# Patient Record
Sex: Male | Born: 1980 | Race: White | Hispanic: No | Marital: Married | Smoking: Current some day smoker
Health system: Southern US, Community
[De-identification: ages and names within clinical notes are randomized; demographics above are authoritative.]

## PROBLEM LIST (undated history)

## (undated) DIAGNOSIS — D126 Benign neoplasm of colon, unspecified: Secondary | ICD-10-CM

## (undated) DIAGNOSIS — G4733 Obstructive sleep apnea (adult) (pediatric): Secondary | ICD-10-CM

## (undated) DIAGNOSIS — M67979 Unspecified disorder of synovium and tendon, unspecified ankle and foot: Secondary | ICD-10-CM

## (undated) DIAGNOSIS — IMO0002 Reserved for concepts with insufficient information to code with codable children: Secondary | ICD-10-CM

## (undated) DIAGNOSIS — I10 Essential (primary) hypertension: Secondary | ICD-10-CM

## (undated) DIAGNOSIS — R7303 Prediabetes: Secondary | ICD-10-CM

## (undated) HISTORY — DX: Obstructive sleep apnea (adult) (pediatric): G47.33

## (undated) HISTORY — PX: FOOT SURGERY: SHX648

## (undated) HISTORY — DX: Prediabetes: R73.03

## (undated) HISTORY — DX: Reserved for concepts with insufficient information to code with codable children: IMO0002

## (undated) HISTORY — DX: Essential (primary) hypertension: I10

## (undated) HISTORY — PX: URETHROPLASTY: SHX499

---
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
## 2001-09-16 ENCOUNTER — Encounter: Payer: Self-pay | Admitting: Emergency Medicine

## 2001-09-16 ENCOUNTER — Emergency Department (HOSPITAL_COMMUNITY): Admission: EM | Admit: 2001-09-16 | Discharge: 2001-09-17 | Payer: Self-pay | Admitting: Emergency Medicine

## 2004-09-10 ENCOUNTER — Ambulatory Visit (HOSPITAL_BASED_OUTPATIENT_CLINIC_OR_DEPARTMENT_OTHER): Admission: RE | Admit: 2004-09-10 | Discharge: 2004-09-10 | Payer: Self-pay | Admitting: Urology

## 2006-09-03 ENCOUNTER — Ambulatory Visit: Payer: Self-pay | Admitting: Family Medicine

## 2006-09-06 ENCOUNTER — Ambulatory Visit: Payer: Self-pay | Admitting: Family Medicine

## 2007-01-03 ENCOUNTER — Ambulatory Visit: Payer: Self-pay | Admitting: Family Medicine

## 2007-07-18 ENCOUNTER — Ambulatory Visit (HOSPITAL_COMMUNITY): Admission: RE | Admit: 2007-07-18 | Discharge: 2007-07-18 | Payer: Self-pay | Admitting: Urology

## 2007-08-15 HISTORY — PX: URETHRAL STRICTURE DILATATION: SHX477

## 2007-09-06 ENCOUNTER — Ambulatory Visit (HOSPITAL_COMMUNITY): Admission: RE | Admit: 2007-09-06 | Discharge: 2007-09-07 | Payer: Self-pay | Admitting: Urology

## 2007-09-09 ENCOUNTER — Encounter: Payer: Self-pay | Admitting: Family Medicine

## 2007-10-05 ENCOUNTER — Encounter: Payer: Self-pay | Admitting: Family Medicine

## 2008-01-23 ENCOUNTER — Ambulatory Visit: Payer: Self-pay | Admitting: Family Medicine

## 2008-01-24 DIAGNOSIS — N35919 Unspecified urethral stricture, male, unspecified site: Secondary | ICD-10-CM | POA: Insufficient documentation

## 2008-01-26 LAB — CONVERTED CEMR LAB
BUN: 18 mg/dL (ref 6–23)
CO2: 29 meq/L (ref 19–32)
Calcium: 9.3 mg/dL (ref 8.4–10.5)
Chloride: 104 meq/L (ref 96–112)
Cholesterol: 171 mg/dL (ref 0–200)
Creatinine, Ser: 1.1 mg/dL (ref 0.4–1.5)
GFR calc Af Amer: 103 mL/min
GFR calc non Af Amer: 85 mL/min
Glucose, Bld: 103 mg/dL — ABNORMAL HIGH (ref 70–99)
HDL: 27.5 mg/dL — ABNORMAL LOW (ref >39.0)
LDL Cholesterol: 122 mg/dL — ABNORMAL HIGH (ref 0–99)
Potassium: 4.3 meq/L (ref 3.5–5.1)
Sodium: 140 meq/L (ref 135–145)
TSH: 2.28 microintl units/mL (ref 0.35–5.50)
Total CHOL/HDL Ratio: 6.2
Triglycerides: 106 mg/dL (ref 0–149)
VLDL: 21 mg/dL (ref 0–40)

## 2008-12-17 ENCOUNTER — Telehealth: Payer: Self-pay | Admitting: Family Medicine

## 2011-02-26 ENCOUNTER — Ambulatory Visit (INDEPENDENT_AMBULATORY_CARE_PROVIDER_SITE_OTHER): Payer: BC Managed Care – PPO | Admitting: Family Medicine

## 2011-02-26 ENCOUNTER — Other Ambulatory Visit: Payer: Self-pay | Admitting: Family Medicine

## 2011-02-26 ENCOUNTER — Ambulatory Visit (INDEPENDENT_AMBULATORY_CARE_PROVIDER_SITE_OTHER)
Admission: RE | Admit: 2011-02-26 | Discharge: 2011-02-26 | Disposition: A | Discharge status: Home / Self Care | Payer: BC Managed Care – PPO | Source: Ambulatory Visit | Attending: Family Medicine | Admitting: Family Medicine

## 2011-02-26 ENCOUNTER — Encounter: Payer: Self-pay | Admitting: Family Medicine

## 2011-02-26 DIAGNOSIS — M7660 Achilles tendinitis, unspecified leg: Secondary | ICD-10-CM | POA: Insufficient documentation

## 2011-02-26 DIAGNOSIS — M79609 Pain in unspecified limb: Secondary | ICD-10-CM

## 2011-02-26 DIAGNOSIS — M6788 Other specified disorders of synovium and tendon, other site: Secondary | ICD-10-CM | POA: Insufficient documentation

## 2011-02-26 DIAGNOSIS — M67972 Unspecified disorder of synovium and tendon, left ankle and foot: Secondary | ICD-10-CM | POA: Insufficient documentation

## 2011-03-03 NOTE — Assessment & Plan Note (Signed)
Summary: RIGHT FOOT PAIN/CLE  [employer]   Vital Signs:  Patient profile:   30 year old male Height:      68 inches Weight:      326 pounds BMI:     49.75 Temp:     98.0 degrees F oral Pulse rate:   76 / minute Pulse rhythm:   regular BP sitting:   130 / 82  (left arm) Cuff size:   large  Vitals Entered By: Benny Lennert CMA Duncan Dull) (February 26, 2011 2:58 PM)  History of Present Illness: Chief complaint Right foot pain  30 year old male:  ppleasant gentleman who is status post  a significant trauma to his foot as a child, and he had to have surgery  of some sort in and around the 2nd and 3rd metatarsal region after shattering  the bone when he was younger.  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 He also has a very large right-sided bunion.  He traumatized  the dorsal aspect of his foot approximately 3 weeks ago he has had some persistent  swelling. It hurts a little bit, but he is even been able to play basketball.  Achilles tendinitis. The patient describes posterior heel pain in the Achilles area bilaterally, this has been ongoing for about 2 years or so. Particularly flares up when he plays has to ball, which he does do about twice a week.  At this point is not in any interventions.  REVIEW OF SYSTEMS  GEN: No systemic complaints, no fevers, chills, sweats, or other acute illnesses MSK: Detailed in the HPI GI: tolerating PO intake without difficulty Neuro: No numbness, parasthesias, or tingling associated. Otherwise the pertinent positives of the ROS are noted above.    Allergies (verified): No Known Drug Allergies  Past History:  Past medical, surgical, family and social histories (including risk factors) reviewed, and no changes noted (except as noted below).  Past Surgical History: Reviewed history from 01/23/2008 and no changes required. urethral stricture, repaired x 4 , last one 09/08  Family  History: Reviewed history from 01/23/2008 and no changes required. no MI in family uncle with thyroid problems  Social History: Reviewed history from 01/23/2008 and no changes required. Married Never Smoked Alcohol use-yes, 1 beer every 4-5 months Drug use-no Regular exercise-no  Review of Systems       REVIEW OF SYSTEMS  GEN: No systemic complaints, no fevers, chills, sweats, or other acute illnesses MSK: Detailed in the HPI GI: tolerating PO intake without difficulty Neuro: No numbness, parasthesias, or tingling associated. Otherwise the pertinent positives of the ROS are noted above.    Physical Exam  General:  GEN: Well-developed,well-nourished,in no acute distress; alert,appropriate and cooperative throughout examination HEENT: Normocephalic and atraumatic without obvious abnormalities. No apparent alopecia or balding. Ears, externally no deformities PULM: Breathing comfortably in no respiratory distress EXT: No clubbing, cyanosis, or edema PSYCH: Normally interactive. Cooperative during the interview. Pleasant. Friendly and conversant. Not anxious or depressed appearing. Normal, full affect.  Msk:  RIGHT foot: Minimal range of motion with dorsiflexion and plantar flexion.  Nontender along lateral malleolus, medial malleolus, navicular, cuboid,  5th metatarsal.  Very large bunion formation. Shortened 2nd metatarsal with elevated 2nd phalanx.  Mild to moderate tenderness and swelling around the 2nd through 4th metatarsals. Primarily in the dorsal aspect.  No significant bruising.  LEFT foot:  Similar restriction in motion.  Nontender throughout bony forefoot, midfoot, and  hindfoot.   Nontender lump her heels and posterior tibialis tendons.  Achilles tender mildly without nodule formation.   Impression & Recommendations:  Problem # 1:  FOOT PAIN, RIGHT (ICD-729.5) Assessment New XR, 3 view, foot series Indication: foot pain Findings: no evidence of acute  fracture or dislocation 3 is evidence of significant chronic bone change in the 2nd metatarsal, distally, with a shortened bone, and evidence of calcification and bone formation with irregularities, most likely due to chronic  changes secondary to prior trauma  rec relatively stiff soled shoe relative decrease in activity for a couple of more weeks  Orders: T-Foot Right (73630TC)  Problem # 2:  ACHILLES TENDINITIS, MILD (ICD-726.71) Assessment: New Pathophysiology of achilles tendinopathy reviewed.  Additionally, I have given the patient the program emphasizing eccentric overloading detailed in the instructions based on Dr. Renato Gails work and protocols.  mild, would only begin here   Patient Instructions: 1)  Achilles Rehab 2)  Begin with easy walking, heel, toe and backwards 3)  Calf raises on a step 4)  First lower and then raise on 1 foot 5)  If this is painful lower on 1 foot but do the heel raise on both feet 6)  Begin with 3 sets of 10 repetitions 7)  Increase by 5 repetitions every 3 days 8)  Goal is 3 sets of 30 repetitions 9)  Do with both knee straight and knee at 20 degrees of flexion 10)  If pain persists at 3 sets of 30 - add backpack with 5 lbs 11)  Increase by 5 lbs per week to max of 30 lbs    Orders Added: 1)  T-Foot Right [73630TC] 2)  Est. Patient Level IV [16109]    Prior Medications (reviewed today): None Current Allergies (reviewed today): No known allergies

## 2011-04-28 NOTE — Op Note (Signed)
NAME:  Patrick Gallagher, Patrick Gallagher             ACCOUNT NO.:  [id number]   MEDICAL RECORD NO.:  [id number]          PATIENT TYPE:  OIB   LOCATION:  1429                         [facility]ACILITY:  [facility]   PHYSICIAN:  Martina Sinner, MD DATE OF BIRTH:  1981-05-03   DATE OF PROCEDURE:  09/06/2007  DATE OF DISCHARGE:                               OPERATIVE REPORT   PRE AND POSTOPERATIVE DIAGNOSIS:  Urethral stricture disease.   SURGERY:  One-stage urethroplasty plus full-thickness penile skin graft  plus cystoscopy.   SURGEON:  Martina Sinner, MD   ASSISTANT:  Dr. 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  Patrick Gallagher the  balloon dilated on a semi-urgent basis approximately six weeks ago.  Patrick Gallagher  flow was dramatically lessened.   The patient given preoperative antibiotics.  Preoperative blood work was  normal.  Patrick Gallagher spent approximately 15 minutes positioned Patrick Gallagher legs and  changed yellow fin stirrups make certain that the stirrups would hold  Patrick Gallagher legs study.  Patrick Gallagher had tremendously large thighs and heavy legs.  Patrick Gallagher was  very happy with the position of Patrick Gallagher legs, went ahead and proceeded with  the case.   Patrick Gallagher first cystoscoped the patient.  Patrick Gallagher stricture had recurred.  It looked  like a Information systems manager tissue had almost totally grown over the urethral lumen.  Patrick Gallagher easily passed a sensor wire, followed by an open-end ureteral catheter  into the bladder.  Patrick Gallagher removed the sensor wire return of urine.  Patrick Gallagher used  this as a landmark.  Patrick Gallagher cystoscoped the patient using 18-French red  rubber catheter to mark the position of the stricture.  The and the  catheter actually broke through the stricture so Patrick Gallagher used a lot of  cystoscopy to mark the position of this stricture.   Patrick Gallagher used a midline perineal incision.  Patrick Gallagher had sewn a green towel over the  rectum to mark it.  Patrick Gallagher dissected down through subcutaneous tissue  mobilized it off the bulbous spongiosis muscles.  Patrick Gallagher split the bulbous  spongiosis muscle in the midline  and dissected laterally.  It was more  difficult to dissect the muscle off the proximal bulb where there was an  obvious stricture.  Patrick Gallagher had a 1.5 cm hour glass effect to the bulb after  Patrick Gallagher mobilized the bulbous spongiosis muscle and minimally took down the  perineal tendon.  Patrick Gallagher cystoscoped the patient and again Patrick Gallagher was happy that Patrick Gallagher  had dissected back far enough, Patrick Gallagher.e. mobilized the bulb.   With a red rubber catheter in place to the areas of the stricture.  Patrick Gallagher  clamped it with a Babcock.  Patrick Gallagher cut down onto the red rubber catheter,  delivering its tip.  Patrick Gallagher completely opened up the stricture along its  length using eight interrupted 3-0 silk pop offs to pick up sponge and  urethral mucosal roof strip under tension to stop bleeding and lay the  roof strip open.   Patrick Gallagher then cystoscoped the patient.  Bladder mucosa and trigone were normal.  Periprostatic urethra was normal.  Patrick Gallagher could identify the verumontanum.  Patrick Gallagher  had approximately 1 cm of healthy urethra before the stricture was  identified.  There is no question that Patrick Gallagher spatulated the stricture back  to healthy tissue proximally.  Patrick Gallagher also cystoscoped the distal urethra and  was happy that Patrick Gallagher had spatulated it into healthy urethra.   Patrick Gallagher measured a 3.5 cm urethral stricture and approximately 1.5 cm urethral  roof strip.   With the penis on minimal stretch, Patrick Gallagher drew out a 3.5 x 2 cm wide  elliptical full-thickness penile skin graft.  Patrick Gallagher used a scalpel and  scissors to procure the graft.  Patrick Gallagher cauterized bleeding sites.  Patrick Gallagher closed  the penile skin with multiple 3-0 chromic sutures to minimize the risk  of splitting with erections.   The short penile skin graft was placed on a block and pinned.  It was  defatted.   Patrick Gallagher placed two 4-0 Vicryl sutures on RB1 at the proximal apex and distal  apex of the spatulated urethra.  Patrick Gallagher then sewed in the graft starting more  cephalad first.  Patrick Gallagher would pick up a little bit of spongiosis tissue,  urethral roof strip and placed it  through full-thickness penile skin  graft.  This was done bilaterally.  Patrick Gallagher ran the suture upwards and  downwards and tied them in the middle.  Patrick Gallagher was very happy with the  placement of the graft.   Patrick Gallagher minimally fenestrated the graft with a 15 blade.  Patrick Gallagher then placed two 3-  0 Vicryl sutures through the bulbous spongiosis and picked up the graft  to help sew the graft to its vascular bed.  Patrick Gallagher then closed the bulbous  spongiosis muscle with running 3-0 Vicryl suture.  Hemostasis was  excellent.  Patrick Gallagher made certain that all the of the bulbous spongiosis was  reapproximated.   Patrick Gallagher made a 1 cm incision in the left lower scrotum.  Brought in a number  10 Blake drain.  It was sewn in place with 2-0 silk.  The drain was  shortened and placed deep to the bulbous spongiosis muscle.  Patrick Gallagher closed  soft tissue near the scrotum and picked up the bulbous spongiosis  muscle.  Patrick Gallagher then closed two more layers of soft tissue and then the skin  with 4-0 Vicryl subcuticular.   Hemostasis was good throughout the case.  Blood loss was approximately  100-150 mL.  Patrick Gallagher leg position was good.  We washed Patrick Gallagher off very well.  On the penile skin incision Patrick Gallagher put Steri-Strips followed by a small piece  of Telfa and a very loose Coban dressing.  Patrick Gallagher could see the tip of the  penis.  On the perineum Patrick Gallagher placed Steri-Strips, a small piece of Telfa  followed by multiple fluffs.  We tailored mesh pants so they would fit.  The mesh pants were applied.  Foley catheter was taped to Patrick Gallagher lower  belly so the penis would be facing upward.  Velcro leg strap was used.  A 200 mL of clear urine exited the catheter when Patrick Gallagher hooked it up to  straight drainage.   Patrick Gallagher was pleased with Patrick Gallagher urethroplasty.  Patrick Gallagher will have a  pericatheter urethrogram in approximately 14-16 days.  Hopefully the  operation reached Patrick Gallagher treatment goal.           ______________________________  Martina Sinner, MD  Electronically Signed     SAM/MEDQ  D:  09/06/2007   T:  09/07/2007  Job:  [id number]

## 2011-04-28 NOTE — Op Note (Signed)
NAME:  Patrick Gallagher, Patrick Gallagher             ACCOUNT NO.:  [id number]   MEDICAL RECORD NO.:  [id number]          PATIENT TYPE:  AMB   LOCATION:  DAY                          FACILITY:  [facility]   PHYSICIAN:  Martina Sinner, MD DATE OF BIRTH:  1981/05/30   DATE OF PROCEDURE:  07/18/2007  DATE OF DISCHARGE:                               OPERATIVE REPORT   PREOPERATIVE DIAGNOSIS:  Urethral stricture.   POSTOPERATIVE DIAGNOSIS:  Urethral stricture.   SURGERY:  Cystogram.   SURGEON:  Martina Sinner, MD   DESCRIPTION OF PROCEDURE:  Patrick Gallagher had a cystogram today  performed at the time of his balloon dilation of a stricture.   Under anesthesia, I was able to place a sensor wire followed by a 6-  [place] open-ended ureteral catheter fluoroscopically into the bladder.  I then injected 10 mL of contrast.  I could easily see his bladder that  was smooth-walled and spherical in shape.  He did not reflux.  His  bladder neck was shut.  Obviously, he was in retention, since there  looked to be approximately 300-400 mL of urine in his bladder with  dilute contrast.   Following the procedure, his bladder was emptied.  I did more  fluoroscopy and no dye was seen within the bladder.           ______________________________  Martina Sinner, MD  Electronically Signed     SAM/MEDQ  D:  07/18/2007  T:  07/19/2007  Job:  [id number]

## 2011-04-28 NOTE — Op Note (Signed)
NAME:  Patrick Gallagher, Patrick Gallagher             ACCOUNT NO.:  [id number]   MEDICAL RECORD NO.:  [id number]          PATIENT TYPE:  AMB   LOCATION:  DAY                          FACILITY:  [facility]   PHYSICIAN:  Martina Sinner, MD DATE OF BIRTH:  1981/07/19   DATE OF PROCEDURE:  07/18/2007  DATE OF DISCHARGE:                               OPERATIVE REPORT   PREOPERATIVE DIAGNOSIS:  Urethral stricture with retention.   POSTOPERATIVE DIAGNOSIS:  Urethral stricture with retention.   SURGERY:  Cystoscopy, cystogram, balloon dilation of urethral stricture  and insertion of Foley catheter.   HISTORY:  [place] has a recurrent stricture and was in near  retention over the weekend and this morning.  On a semi-urgent basis I  thought it was best to dilate his stricture under anesthesia.   The patient consented to a possible urethroplasty or French suprapubic  tube before we took him back to the operating room.  He was prepped for  this and extra care was taken to minimize the risk of compartment  syndrome, neuropathy, and DVT when I positioned his legs.   DESCRIPTION OF PROCEDURE:  I cystoscoped the patient with a 21-French  scope.  Fortunately, I could see the narrow urethral lumen in the mid  bulbar urethra.  Cystoscopically it was quite easy and under  fluoroscopic guidance it was quite easy to pass a sensor wire up into  the bladder.  Because there was a little bit of resistance, I then  passed a 6-French open ureteral catheter fluoroscopically over the  sensor wire and removing the wire.  I then did a cystogram.  There was  no question we were in the bladder and his bladder was full of urine.   I then replaced the sensor wire, and then placed the 15 cm x 24-French  balloon dilation catheter.  It was used for nephrostomy traction over  the sensor wire, up to the level of the bladder neck, bridging the  stricture.  I dilated using dye at 18 atmospheres.  This was done under  fluoroscopic  guidance.   After dilation, I removed the dye from the balloon and removed the  balloon dilation catheter.  I then cystoscoped the patient easily  through the stricture, up into the bladder.  I removed the wire and  emptied his bladder.   FINDINGS:  On cystoscopy, he did not have bladder trabeculation or  bladder stones.  Bladder mucosa and trigone were normal.  There was no  stitch, foreign body or carcinoma.  The prostatic urethra was normal.  External sphincter was normal.  He did not have any stricture in the  proximal 2 cm of his bulbar urethra.  He then had a 2 cm stricture that  was dilated to approximately a 24-French from the balloon dilation  procedure.  The urethra distal to this was healthy.   ASSESSMENT:  In my opinion, this stricture is likely due to a travel  injury.  The length of it is probably due to the number of procedures he  has had previously.  He is a good candidate for an onlay  procedure.  I  am not going to do an end-to-end anastomotic repair.   He is going to keep his Foley catheter in for two and a half days.  I  gave him ciprofloxacin and pain medicine.  I will follow him over the  phone until we remove his catheter on Thursday morning.  He is booked  for urethroplasty, which we will perform in September.           ______________________________  Martina Sinner, MD  Electronically Signed     SAM/MEDQ  D:  07/18/2007  T:  07/18/2007  Job:  [id number]

## 2011-05-01 NOTE — Op Note (Signed)
NAME:  Patrick Gallagher, Patrick Gallagher             ACCOUNT NO.:  [id number]   MEDICAL RECORD NO.:  [id number]          PATIENT TYPE:  AMB   LOCATION:  [facility]                         FACILITY:  [facility]   PHYSICIAN:  Valetta Fuller, M.D.  DATE OF BIRTH:  1980/12/23   DATE OF PROCEDURE:  09/10/2004  DATE OF DISCHARGE:                                 OPERATIVE REPORT   PREOPERATIVE DIAGNOSIS:  Bulbar urethral stricture.   POSTOPERATIVE DIAGNOSIS:  Bulbar urethral stricture.   PROCEDURES PERFORMED:  1.  Cystoscopy.  2.  Laser internal urethrotomy (optical internal urethrotomy).   SURGEON:  Valetta Fuller, M.D.   RESIDENT SURGEON:  Thyra Breed, M.D.   ANESTHESIA:  Laryngeal mask airway.   COMPLICATIONS:  None.   DRAINS:  54 French [place] tip catheter to straight drain.   INDICATIONS FOR PROCEDURE:  Patrick Gallagher is a 30 year old male with a  history of urethral stricture in his bulbar urethra of unknown etiology.  Approximately two months prior, the patient underwent dilation of his  stricture.  Since this time, he has had successive increase in his lower  urinary tract symptoms including frequency, urgency, and feeling of  incomplete emptying.  Office cystoscopy showed recurrence of his stricture.  He is brought to the operating room today to undergo dilation of his  stricture.  Specifically, he has consented to undergo cystoscopy and optical  internal urethrotomy as well as possible laser ablation of the stricture  tissue.  The patient understands the risks, benefits, and alternatives of  the procedure, including bleeding, infection, damage to adjacent structures,  failure of the procedure, need for future reoperation, as well as risk of  anesthesia.  Informed consent is obtained.   PROCEDURE IN DETAIL:  Following  identification by his arm bracelet, the  patient was brought to the operating room and placed in the supine position.  He underwent successful induction of general endotracheal  anesthesia and  received preoperative IV antibiotics.  He was then moved to the dorsal  lithotomy position.  His perineum and genitalia were prepped with Betadine  and draped in the usual sterile fashion.  A 12-degree cystoscopic lens  through a 22.5 French sheath was then inserted into the urethra.  This  revealed a normal-appearing anterior urethra.  However, upon entry to the  bulbar urethra, the patient had obvious stricture of approximately 6-8  [place],  Using a .038 glide wire, we passed through the area of stricture  and passed the wire into the bladder.  The cystoscope was then removed.  The  cystoscope reinserted alongside the wire.  We used a 220 micron laser fiber  at this time.  All operating room personnel, including the patient, had  laser glasses.  We then lasered the area of stricture in a circumferential  fashion to remove any stricture bands at the urethra.  In fact, the  cystoscope passed through the area of stricture rather easily at this point  and into the bladder.  The bladder appeared normal, with both the right and  the left ureteral orifice in their normal anatomic location.  Pancystoscopy  revealed no  evidence of papillary tumors, mucosal irregularities, foreign  bodies, papillary tumors, or stones within the bladder.  We then returned to  the area of stricture, which appeared to be wide open at this point.  Hemostasis was excellent.  We then passed a 22 French Councill tip catheter  over the glide wire, and the wire was removed.  The Foley balloon was  filled, and the bladder was drained.  Prior to placement of Foley catheter,  a lidocaine Uro-Jet was utilized for postoperative analgesia.  The patient  tolerated the procedure well, and there were no complications.  Please note  that Dr. Barron Alvine was present and participated in the entire procedure,  as he was the responsible surgeon.   DISPOSITION:  After waking from general anesthesia, the patient was   transferred to the postanesthesia care unit in stable condition.  From here,  the would be discharge to home.  He will return to the office on Monday  September 15, 2004 for removal of his Foley catheter.  He is given a  prescription for Vicodin, Cipro, as well as Ditropan.      EG/MEDQ  D:  09/10/2004  T:  09/11/2004  Job:  [id number]

## 2011-09-01 ENCOUNTER — Emergency Department: Payer: Self-pay | Admitting: Emergency Medicine

## 2011-09-24 LAB — CBC
HCT: 43.6
Hemoglobin: 15.3
MCHC: 35
MCV: 86.4
Platelets: 337
RBC: 5.05
RDW: 12.1
WBC: 5.9

## 2011-09-24 LAB — PROTIME-INR
INR: 0.9
Prothrombin Time: 12.7

## 2012-02-01 ENCOUNTER — Encounter: Payer: Self-pay | Admitting: Family Medicine

## 2012-02-01 ENCOUNTER — Telehealth: Payer: Self-pay | Admitting: Family Medicine

## 2012-02-01 ENCOUNTER — Ambulatory Visit (INDEPENDENT_AMBULATORY_CARE_PROVIDER_SITE_OTHER): Payer: BC Managed Care – PPO | Admitting: Family Medicine

## 2012-02-01 VITALS — BP 120/84 | HR 72 | Temp 97.5°F | Ht 68.0 in | Wt 334.0 lb

## 2012-02-01 DIAGNOSIS — J329 Chronic sinusitis, unspecified: Secondary | ICD-10-CM

## 2012-02-01 DIAGNOSIS — B9689 Other specified bacterial agents as the cause of diseases classified elsewhere: Secondary | ICD-10-CM | POA: Insufficient documentation

## 2012-02-01 MED ORDER — AMOXICILLIN-POT CLAVULANATE 875-125 MG PO TABS: 1.0000 | ORAL_TABLET | Freq: Two times a day (BID) | ORAL | Status: AC

## 2012-02-01 NOTE — Patient Instructions (Signed)
Take the augmentin for sinus infection  Drink lots of fluids Try nasal saline spray or netti pot for congestion mucinex is helpful too  Update if not starting to improve in a week or if worsening

## 2012-02-01 NOTE — Progress Notes (Signed)
  Subjective:    Patient ID: Patrick Gallagher, male    DOB: Sep 09, 1981, 31 y.o.   MRN: [id number]  HPI Has been sick for at least 3 weeks -- got better and then worse  Started as a cold more or less Now ears are clogged/ L ear hurts  L side of throat/ neck are sore too Sinuses are full with facial pressure , some pressure in back of head Green nasal d/c Cough a little - more at night   Day quil/ ny quil - mildly helpful No fever  Patient Active Problem List  Diagnoses  . MORBID OBESITY  . URETHRAL STRICTURE  . ACHILLES TENDINITIS, MILD  . FOOT PAIN, RIGHT  . Sinusitis, bacterial   No past medical history on file. Past Surgical History  Procedure Date  . Urethral stricture dilatation 09/08    repaired x 4. last one 09/08   History  Substance Use Topics  . Smoking status: Never Smoker   . Smokeless tobacco: Not on file  . Alcohol Use: Yes     one beer every 4-5 months   No family history on file. No Known Allergies No current outpatient prescriptions on file prior to visit.       Review of Systems Review of Systems  Constitutional: Negative for fever, appetite change, and unexpected weight change. pos for malaise  Eyes: Negative for pain and visual disturbance.  ENT pos for sinus pressure/ congestion/ St and ear pain  Respiratory: Negative for sob or wheeze    Cardiovascular: Negative for cp or palpitations    Gastrointestinal: Negative for nausea, diarrhea and constipation.  Genitourinary: Negative for urgency and frequency.  Skin: Negative for pallor or rash   Neurological: Negative for weakness, light-headedness, numbness and pos for headache Hematological: Negative for adenopathy. Does not bruise/bleed easily.  Psychiatric/Behavioral: Negative for dysphoric mood. The patient is not nervous/anxious.          Objective:   Physical Exam  Constitutional: He appears well-developed and well-nourished. No distress.       Obese and well appearing   HENT:    Head: Normocephalic and atraumatic.  Right Ear: External ear normal.  Left Ear: External ear normal.  Mouth/Throat: Oropharynx is clear and moist. No oropharyngeal exudate.       Nares are injected and congested  bilat maxillary/ frontal sinus tenderness TMs are dull Post nasal drainage noted   Eyes: Conjunctivae and EOM are normal. Pupils are equal, round, and reactive to light. Right eye exhibits no discharge. Left eye exhibits no discharge.  Neck: Normal range of motion. Neck supple. No thyromegaly present.  Cardiovascular: Normal rate and regular rhythm.   Pulmonary/Chest: Effort normal and breath sounds normal. No respiratory distress. He has no wheezes. He has no rales.  Lymphadenopathy:    He has no cervical adenopathy.  Neurological: He is alert. No cranial nerve deficit.  Skin: Skin is warm and dry. No erythema. No pallor.  Psychiatric: He has a normal mood and affect.          Assessment & Plan:

## 2012-02-01 NOTE — Assessment & Plan Note (Signed)
After 3 weeks or uri with worsening congestion and facial pressure/purulent nasal drainage Disc symptomatic care - see instructions on AVS  Will cover with augmentin Update if not starting to improve in a week or if worsening

## 2012-02-01 NOTE — Telephone Encounter (Signed)
Triage Record Num: [id number] Operator: Patrick Gallagher Patient Name: Patrick Gallagher Call Date & Time: 02/01/2012 9:04:35AM Patient Phone: [phone] PCP: Patient Gender: Male PCP Fax : Patient DOB: 02-21-1981 Practice Name: [facility] Reason for Call: Caller: Patrick Gallagher/Patient; PCP: Patrick Gallagher.; CB#: [phone]; Call regarding "Sinus Infection"; Onset est 3 weeks. Face pain, left earache, ST. Afebrile/subjective. Taking Nyquil / Dayquil. Appt at 1000 w/ Patrick Gallagher. Home care for the interim and parameters for callback per URI protocol. Protocol(s) Used: Upper Respiratory Infection (URI) Recommended Outcome per Protocol: See Provider within 24 hours Reason for Outcome: Mild to moderate headache for more than 24 hours unrelieved with nonprescription medications Care Advice: ~ Use a cool mist humidifier to moisten air. Be sure to clean according to manufacturer's instructions. Call provider if headache worsens, develops temperature greater than 101.72F (38.1C) or any temperature elevation in a geriatric or immunocompromised patient (such as diabetes, HIV/AIDS, chemotherapy, organ transplant, or chronic steroid use). ~ ~ SYMPTOM / CONDITION MANAGEMENT A warm, moist compress placed on face, over eyes for 15 to 20 minutes, 5 to 6 times a day, may help relieve the congestion. ~ Most adults need to drink 6-10 eight-ounce glasses (1.2-2.0 liters) of fluids per day unless previously told to limit fluid intake for other medical reasons. Limit fluids that contain caffeine, sugar or alcohol. Urine will be a very light yellow color when you drink enough fluids. ~ Analgesic/Antipyretic Advice - Acetaminophen: Consider acetaminophen as directed on label or by pharmacist/provider for pain or fever. PRECAUTIONS: - Use only if there is no history of liver disease, alcoholism, or intake of three or more alcohol drinks per day. - If approved by provider when breastfeeding. - Do not exceed  recommended dose or frequency. ~ 02/01/2012 9:17:42AM Page 1 of 1 [id number]

## 2013-05-28 ENCOUNTER — Emergency Department: Payer: Self-pay | Admitting: Emergency Medicine

## 2013-05-30 ENCOUNTER — Ambulatory Visit (INDEPENDENT_AMBULATORY_CARE_PROVIDER_SITE_OTHER): Payer: Managed Care, Other (non HMO) | Admitting: Family Medicine

## 2013-05-30 ENCOUNTER — Encounter: Payer: Self-pay | Admitting: Family Medicine

## 2013-05-30 VITALS — BP 116/78 | HR 60 | Temp 98.3°F | Wt 334.0 lb

## 2013-05-30 DIAGNOSIS — H6091 Unspecified otitis externa, right ear: Secondary | ICD-10-CM

## 2013-05-30 DIAGNOSIS — H60399 Other infective otitis externa, unspecified ear: Secondary | ICD-10-CM

## 2013-05-30 MED ORDER — HYDROCODONE-ACETAMINOPHEN 5-325 MG PO TABS
1.0000 | ORAL_TABLET | Freq: Four times a day (QID) | ORAL | Status: DC | PRN
Start: 2013-05-30 — End: 2013-06-27

## 2013-05-30 MED ORDER — AMOXICILLIN-POT CLAVULANATE 875-125 MG PO TABS: 1.0000 | ORAL_TABLET | Freq: Two times a day (BID) | ORAL | Status: AC

## 2013-05-30 NOTE — Assessment & Plan Note (Signed)
With TM perforation and possible bullous otitis media/myringitis. Treat with augmentin course, continue ibuprofen 800mg  tid, and hydrocodone prn breakthrough pain. Continue presumed ciprodex drops. Recommended return in 4 wks to recheck R ear, and if persistent TM abnormality, refer to ENT to r/o cholesteatoma. Red flags to return sooner discussed (fevers, worsening pain despite abx, trismus).

## 2013-05-30 NOTE — Progress Notes (Signed)
Pt seen and examined with PA student Anna Genre  CC: R ear pain  At beach over weekend.   Noticed ear pain 5 d ago prior to beach trip.  In and out of water over last several days. When he noticed progressively worsening right facial pain/swelling, he decided to go to [facility] ER on Sunday night.  Was evaluated and treated for outer ear infection with cipro drops bid and ibuprofen.  Despite this, swelling worsening - trouble eating 2/2 pain with chewing.  Has been taking ibuprofen 800mg  Q4 hours. Noticing drainage out of ear. Trouble sleeping 2/2 pain. Some tinnitus and hearing loss of right ear. Denies dysphagia. Denies fevers, viral uri, congestion.   Smoker at home - outside.  No past medical history on file.   PE: GEN: WDWN CM, uncomfortable appearing. HEENT:  PEERLA, EOMI L TM intact, good light reflex R external canal with significant swelling and erythema, no tenderness to palpation at pinna and tragus, mild swelling noted at right cheek/jaw without erythema or significant pain to palpation.  R TM bulging, and bullous, very mobile with insufflation, evident fluid distal to TM. MMM, no oropharyngeal exudate, nares clear.  No AC or auricular LAD.

## 2013-05-30 NOTE — Progress Notes (Signed)
  Subjective:    Patient ID: Patrick Gallagher, male    DOB: 1981/06/25, 32 y.o.   MRN: [id number]  CC: pain in right ear and jaw  Otalgia  Associated symptoms include hearing loss. Pertinent negatives include no coughing, rhinorrhea or sore throat.   Patient developed pain in his right ear on Friday and swelling and pain that developed in his face and around the jaw on Sunday.  The patient reports that he was at the beach all of last week and spent most of the time in water.   Went to the ER on Sunday and was given ear drops with an antibiotic and steroid, and ibuprofen.  The patient states that he has noticed muffled hearing and his ears have been ringing.  He is unable to close his jaw properly and has difficulty chewing and speaking.  Has noticed clear drainage from the ear.  Currently taking 800 mg Ibuprofen 4 times a day for the pain without much relief.  He does not think the ear drops have helped much either.  Has also noticed decreased appetite and difficulty sleeping.  Denies any recent illness or ear infections, and hasn't had an ear infection since he was a child.      Review of Systems  Constitutional: Positive for appetite change. Negative for fever, chills and diaphoresis.  HENT: Positive for hearing loss, ear pain, facial swelling (right side along the jaw), voice change and tinnitus. Negative for congestion, sore throat, rhinorrhea, sneezing, trouble swallowing, postnasal drip and sinus pressure.   Respiratory: Negative for cough and shortness of breath.        Objective:   Physical Exam  Constitutional: He appears well-developed and well-nourished. No distress.  HENT:  Head: Normocephalic and atraumatic.  Right Ear: There is drainage, swelling and tenderness. Tympanic membrane is perforated.  Left Ear: Tympanic membrane, external ear and ear canal normal.  Nose: Nose normal. Right sinus exhibits no maxillary sinus tenderness and no frontal sinus tenderness. Left sinus  exhibits no maxillary sinus tenderness and no frontal sinus tenderness.  Mouth/Throat: Uvula is midline and oropharynx is clear and moist. No oropharyngeal exudate.  Swelling along the right side of the mandible  Eyes: Pupils are equal, round, and reactive to light. Right eye exhibits no discharge. Left eye exhibits no discharge.  Neck: Normal range of motion. No tracheal deviation present.  Lymphadenopathy:    He has no cervical adenopathy.          Assessment & Plan:  Otitis externa & TM perforation -continue ear drops  -Augmentin (875-125 mg) po BID X 10 days -continue Ibuprofen 800 mg prn for pain, but do not exceed 3 tablets daily -Hydrocodone prn for breakthrough pain -f/u in 4 weeks to check for improvement in ear -contact the office if symptoms do not improve or worsen, or difficulty closing jaw persists

## 2013-05-30 NOTE — Patient Instructions (Addendum)
I think you do have outer ear infection, as well as ear drum perforation. Treat with oral antibiotics as well as ear drops. Continue ibuprofen 800mg  up to three times a day (with food). Add on hydrocodone for breakthrough pain as needed. Return in 4 weeks to recheck that ear. Watch for worsening pain, worsening trouble opening/closing mouth despite antibiotic.

## 2013-06-26 ENCOUNTER — Encounter: Payer: Self-pay | Admitting: Radiology

## 2013-06-27 ENCOUNTER — Encounter: Payer: Self-pay | Admitting: Family Medicine

## 2013-06-27 ENCOUNTER — Ambulatory Visit (INDEPENDENT_AMBULATORY_CARE_PROVIDER_SITE_OTHER): Payer: Managed Care, Other (non HMO) | Admitting: Family Medicine

## 2013-06-27 VITALS — BP 118/90 | HR 76 | Temp 98.2°F | Wt 329.2 lb

## 2013-06-27 DIAGNOSIS — H60399 Other infective otitis externa, unspecified ear: Secondary | ICD-10-CM

## 2013-06-27 DIAGNOSIS — H6091 Unspecified otitis externa, right ear: Secondary | ICD-10-CM

## 2013-06-27 NOTE — Progress Notes (Addendum)
  Subjective:    Patient ID: Patrick Gallagher, male    DOB: Jan 07, 1981, 32 y.o.   MRN: [id number]  HPI CC: f/u ear  Seen here 05/30/2013 with dx external otitis with TM perf and possible bullous otitis media/myringitis. Treated with ciprodex and augmentin, using ibuprofen 800mg  and hydrocodone prn breakthrough pain.  No more pain, no fevers, hearing almost back to normal.  Recommended return in 4 wks to recheck R ear, and if persistent TM abnormality, refer to ENT to r/o cholesteatoma.   No past medical history on file.   Review of Systems Per HPI    Objective:   Physical Exam  Nursing note and vitals reviewed. Constitutional: He appears well-developed and well-nourished. No distress.  HENT:  Head: Normocephalic and atraumatic.  Right Ear: Hearing, tympanic membrane, external ear and ear canal normal.  Left Ear: Hearing, tympanic membrane, external ear and ear canal normal.  Nose: Nose normal.  Mouth/Throat: Uvula is midline, oropharynx is clear and moist and mucous membranes are normal. No oropharyngeal exudate, posterior oropharyngeal edema, posterior oropharyngeal erythema or tonsillar abscesses.  Dry wax in R ear canal removed with plastic curette  Eyes: Conjunctivae and EOM are normal. Pupils are equal, round, and reactive to light. No scleral icterus.       Assessment & Plan:

## 2013-06-27 NOTE — Assessment & Plan Note (Signed)
Healing well.  No evidence of cholesteatoma. Hearing back to normal.

## 2014-04-20 ENCOUNTER — Ambulatory Visit (INDEPENDENT_AMBULATORY_CARE_PROVIDER_SITE_OTHER): Payer: Managed Care, Other (non HMO) | Admitting: Family Medicine

## 2014-04-20 ENCOUNTER — Encounter: Payer: Self-pay | Admitting: Family Medicine

## 2014-04-20 VITALS — BP 132/82 | HR 80 | Temp 97.8°F | Wt 307.0 lb

## 2014-04-20 DIAGNOSIS — S59911A Unspecified injury of right forearm, initial encounter: Secondary | ICD-10-CM | POA: Insufficient documentation

## 2014-04-20 DIAGNOSIS — S59909A Unspecified injury of unspecified elbow, initial encounter: Secondary | ICD-10-CM

## 2014-04-20 DIAGNOSIS — S6990XA Unspecified injury of unspecified wrist, hand and finger(s), initial encounter: Secondary | ICD-10-CM

## 2014-04-20 DIAGNOSIS — S59919A Unspecified injury of unspecified forearm, initial encounter: Secondary | ICD-10-CM

## 2014-04-20 MED ORDER — NAPROXEN 500 MG PO TABS: ORAL_TABLET | ORAL | Status: DC

## 2014-04-20 NOTE — Progress Notes (Signed)
Pre visit review using our clinic review tool, if applicable. No additional management support is needed unless otherwise documented below in the visit note. 

## 2014-04-20 NOTE — Assessment & Plan Note (Addendum)
Anticipate biceps strain vs bicipital aponeurosis injury, less likely pronator teres strain but still recommend he see our SM doc to ensure there's not a more involved tendon injury.   Will set up early next week for re evaluation. In interim, rec start naproysn 500mg  bid with food for 5-7 days, start icing forearm, and wrapped proximal forearm in ACE bandage today for further support. Pt agrees with plan.

## 2014-04-20 NOTE — Patient Instructions (Signed)
I do think you pulled your forearm muscle however I want you to see Dr. Tamala Julian to ensure there's not a tendon injury. Pass by marion's office to schedule this. In the meantime, start icing arm, use naprosyn 500mg  twice daily with food for next 5 days, and rest arm.  We have wrapped your arm with ace bandage to use over next few days as well. Let us know if not improving as expected.

## 2014-04-20 NOTE — Progress Notes (Signed)
   BP 132/82  Pulse 80  Temp(Src) 97.8 F (36.6 C) (Oral)  Wt 307 lb (139.254 kg)   CC: forearm injury  Subjective:    Patient ID: Patrick Gallagher, male    DOB: 12-06-1981, 33 y.o.   MRN: [id number]  HPI: Patrick Gallagher is a 33 y.o. male presenting on 04/20/2014 for Forearm pain   Injured R forearm playing basketball yesterday evening.  Went to throw ball underhanded and fellow player blocked him.  Felt pop at [facility] fossa area, started noticing cramping and swelling proximal forearm as well.  Denies numbness, occasional paresthesias at fingertips.  Weakness noted as well especially with heavy lifting  Has tried ibuprofen which helped some. Denies h/o arm injury in the past.  Relevant past medical, surgical, family and social history reviewed and updated as indicated.  Allergies and medications reviewed and updated. No current outpatient prescriptions on file prior to visit.   No current facility-administered medications on file prior to visit.    Review of Systems Per HPI unless specifically indicated above    Objective:    BP 132/82  Pulse 80  Temp(Src) 97.8 F (36.6 C) (Oral)  Wt 307 lb (139.254 kg)  Physical Exam  Nursing note and vitals reviewed. Constitutional: He appears well-developed and well-nourished. No distress.  Musculoskeletal: He exhibits no edema.  FROM at elbow and wrist Mild ventral swelling at [facility] fossa on right as well as tender lump at distal biceps. Unable to perform speeds test 2/2 pain Mild tenderness with yerguson. Minimal decreased strength to testing of arm flexion and forearm supination, tender with this as well  Neurological:  Sensation intact. 2+ radial pulses bilaterally       Assessment & Plan:   Problem List Items Addressed This Visit   Right forearm injury - Primary     Anticipate biceps strain vs bicipital aponeurosis injury, less likely pronator teres strain but still recommend he see our SM doc to ensure there's not a more  involved tendon injury.   Will set up early next week for re evaluation. In interim, rec start naproysn 500mg  bid with food for 5-7 days, start icing forearm, and wrapped proximal forearm in ACE bandage today for further support. Pt agrees with plan.    Relevant Orders      Ambulatory referral to Sports Medicine       Follow up plan: Return if symptoms worsen or fail to improve.

## 2014-04-23 ENCOUNTER — Other Ambulatory Visit (INDEPENDENT_AMBULATORY_CARE_PROVIDER_SITE_OTHER): Payer: Managed Care, Other (non HMO)

## 2014-04-23 ENCOUNTER — Ambulatory Visit (INDEPENDENT_AMBULATORY_CARE_PROVIDER_SITE_OTHER): Payer: Managed Care, Other (non HMO) | Admitting: Family Medicine

## 2014-04-23 ENCOUNTER — Encounter: Payer: Self-pay | Admitting: Family Medicine

## 2014-04-23 ENCOUNTER — Encounter: Payer: Self-pay | Admitting: *Deleted

## 2014-04-23 VITALS — BP 132/84 | HR 97 | Wt 305.0 lb

## 2014-04-23 DIAGNOSIS — S59919A Unspecified injury of unspecified forearm, initial encounter: Secondary | ICD-10-CM

## 2014-04-23 DIAGNOSIS — M79601 Pain in right arm: Secondary | ICD-10-CM

## 2014-04-23 DIAGNOSIS — M79609 Pain in unspecified limb: Secondary | ICD-10-CM

## 2014-04-23 DIAGNOSIS — S59911A Unspecified injury of right forearm, initial encounter: Secondary | ICD-10-CM

## 2014-04-23 DIAGNOSIS — M7660 Achilles tendinitis, unspecified leg: Secondary | ICD-10-CM

## 2014-04-23 DIAGNOSIS — S59909A Unspecified injury of unspecified elbow, initial encounter: Secondary | ICD-10-CM

## 2014-04-23 DIAGNOSIS — S6990XA Unspecified injury of unspecified wrist, hand and finger(s), initial encounter: Secondary | ICD-10-CM

## 2014-04-23 NOTE — Assessment & Plan Note (Signed)
He lists given today as well as home exercise program. We discussed an icing protocol and we discussed proper shoe choices. Patient was to remain active and will need to decrease the amount of swelling in this area. May consider reevaluation at followup.

## 2014-04-23 NOTE — Assessment & Plan Note (Signed)
The brachial radialis tendon appears to be injured. Patient given a counter brace secondary to a size to try to decrease the fulcrum. Patient was put on a lifting restriction for the next 10 days. We discussed icing and anti-inflammatories. Patient will try these interventions and come back in 10 days. As long as he is doing well then we'll give him a home exercise program and have him increase his activity slowly.

## 2014-04-23 NOTE — Progress Notes (Signed)
[facility] [address], [place] [address] Phone: [phone] Subjective:    I'm seeing this patient by the request  of:  Eliezer Lofts, MD   CC: right arm pain.   YQM:VHQIONGEXB Patrick Gallagher is a 33 y.o. male coming in with complaint of right arm pain. Patient wass playing basketball and felt a pop in his right forearm. Patient did have some swelling and some discoloration and stated that he did have significant amount pain. Since that time it seems to be improving slowly. Patient denies any radiation of pain or any numbness in the hand. Patient states that he just tries to hold anything heavy he starts having some discomfort. Patient has never injured this thumb before. Denies any neck pain. States it is more of a dull aching pain. The severity is 4/10.  Patient also talked about bilateral heel pain. Patient mostly around his Achilles. Worse after playing basketball for significant amount of time. Patient is going tried icing. Describes it as a dull aching sensation without any tearing sensation. Pain is 3/10 now but can be as bad as 7/10.     Past medical history, social, surgical and family history all reviewed in electronic medical record.   Review of Systems: No headache, visual changes, nausea, vomiting, diarrhea, constipation, dizziness, abdominal pain, skin rash, fevers, chills, night sweats, weight loss, swollen lymph nodes, body aches, joint swelling, muscle aches, chest pain, shortness of breath, mood changes.   Objective Blood pressure 132/84, pulse 97, weight 305 lb (138.347 kg), SpO2 97.00%.  General: No apparent distress alert and oriented x3 mood and affect normal, dressed appropriately. Severely obese HEENT: Pupils equal, extraocular movements intact  Respiratory: Patient's speak in full sentences and does not appear short of breath  Cardiovascular: No lower extremity edema, non tender, no erythema  Skin: Warm dry intact with no  signs of infection or rash on extremities or on axial skeleton.  Abdomen: Soft nontender  Neuro: Cranial nerves II through XII are intact, neurovascularly intact in all extremities with 2+ DTRs and 2+ pulses.  Lymph: No lymphadenopathy of posterior or anterior cervical chain or axillae bilaterally.  Gait normal with good balance and coordination.  MSK:  Non tender with full range of motion and good stability and symmetric strength and tone of shoulders,  wrist, hip, knee and ankles bilaterally.  Elbow: Right Unremarkable to inspection. No bruising noted Range of motion shows patient does have full range of motion but does have some pain with full extension as well as full supination. Stable to varus, valgus stress. Negative moving valgus stress test. Tenderness near the medial condyle. On palpation patient does have some fullness of the soft tissue compared to the contralateral side but no specific cyst noted. Ulnar nerve does not sublux. Negative cubital tunnel Tinel's. Contralateral elbow unremarkable  Musculoskeletal ultrasound was performed and interpreted by Charlann Boxer D.O.   Elbow: Right Lateral epicondyle and common extensor tendon origin visualized.  No edema, effusions, or avulsions seen.  Radial head unremarkable and located in annular ligament Medial epicondyle and common flexor tendon origin visualized. Patient though at the tuberosity and does have what appears to be a partial tear of the brachial radialis tendon. At patient's bicep tubercle there is also a very small partial tear of the bicep tendon distally as well but no avulsion noted. Ulnar nerve in cubital tunnel unremarkable. Olecranon and triceps insertion visualized and unremarkable without edema, effusion, or avulsion.  No signs olecranon  bursitis. Power doppler signal normal.  IMPRESSION:  Partial brachial radialis tendon tear and distal bicep tendon tear     Impression and Recommendations:     This case  required medical decision making of moderate complexity.

## 2014-04-23 NOTE — Patient Instructions (Signed)
Nice to meet you Ice 20 minutes 2 times a day for arm Ice bath to feet 20 minutes daily.  Exercises for achilles 3 times a week at least.  Limit basketball for next 2 weeks 1 time a week ok.  Wear heel lifts in shoes bilaterally.  [employer] at [employer] can help.  Continue the naproxen Come back in 10 days to make sure arm heals. Will give you exercises for 10 days.

## 2014-05-11 ENCOUNTER — Other Ambulatory Visit: Payer: Managed Care, Other (non HMO)

## 2014-05-11 ENCOUNTER — Ambulatory Visit (INDEPENDENT_AMBULATORY_CARE_PROVIDER_SITE_OTHER): Payer: Managed Care, Other (non HMO) | Admitting: Family Medicine

## 2014-05-11 ENCOUNTER — Encounter: Payer: Self-pay | Admitting: Family Medicine

## 2014-05-11 ENCOUNTER — Encounter: Payer: Self-pay | Admitting: *Deleted

## 2014-05-11 VITALS — BP 122/84 | HR 79 | Ht 69.0 in | Wt 305.0 lb

## 2014-05-11 DIAGNOSIS — S59909A Unspecified injury of unspecified elbow, initial encounter: Secondary | ICD-10-CM

## 2014-05-11 DIAGNOSIS — S59911A Unspecified injury of right forearm, initial encounter: Secondary | ICD-10-CM

## 2014-05-11 DIAGNOSIS — S59919A Unspecified injury of unspecified forearm, initial encounter: Secondary | ICD-10-CM

## 2014-05-11 DIAGNOSIS — S6990XA Unspecified injury of unspecified wrist, hand and finger(s), initial encounter: Secondary | ICD-10-CM

## 2014-05-11 NOTE — Patient Instructions (Addendum)
Good to see you Continue brace as needed Will advance you at work Exercises 3 times a week.  Come back in 3 weeks. We will rescan one more time and likely release you.

## 2014-05-11 NOTE — Assessment & Plan Note (Signed)
Patient did have the distal or brachial radialis tendon injury. Patient is doing significantly better. Ultrasound today shows the patient is feeling well. Patient is encouraged to start doing some more activity and we did lift a partial lifting limited to 35 pounds daily at work. The discussed continue the icing. Patient was given the home exercise program and handout. Patient will come back again in 3 weeks for further evaluation. As long as he is doing well at that time we will release him to full duty.  Spent greater than 25 minutes with patient face-to-face and had greater than 50% of counseling including as described above in assessment and plan.

## 2014-05-11 NOTE — Progress Notes (Signed)
  [facility] [address] [place], [place] [address] Phone: [phone] Subjective:     CC: right arm pain followup.  NID:[id number] ODELL CHOUNG is a 33 y.o. male coming in with complaint of right arm pain. Patient was seen previously and did have a tear of the brachial radialis tendon. Patient was given a counterstrain brace, icing protocol, anti-inflammatories, and discussed avoiding any heavy lifting. Patient states since that time he has made some improvement. Patient states he'll he has a twinge of pain with certain movements or if he holds something fairly heavy. Patient has even been playing basketball without any significant pain. Patient states he is resting comfortably as well.    Past medical history, social, surgical and family history all reviewed in electronic medical record.   Review of Systems: No headache, visual changes, nausea, vomiting, diarrhea, constipation, dizziness, abdominal pain, skin rash, fevers, chills, night sweats, weight loss, swollen lymph nodes, body aches, joint swelling, muscle aches, chest pain, shortness of breath, mood changes.   Objective Blood pressure 122/84, pulse 79, height 5\' 9"  (1.753 m), weight 305 lb (138.347 kg), SpO2 97.00%.  General: No apparent distress alert and oriented x3 mood and affect normal, dressed appropriately.  obese HEENT: Pupils equal, extraocular movements intact  Respiratory: Patient's speak in full sentences and does not appear short of breath  Cardiovascular: No lower extremity edema, non tender, no erythema  Skin: Warm dry intact with no signs of infection or rash on extremities or on axial skeleton.  Abdomen: Soft nontender  Neuro: Cranial nerves II through XII are intact, neurovascularly intact in all extremities with 2+ DTRs and 2+ pulses.  Lymph: No lymphadenopathy of posterior or anterior cervical chain or axillae bilaterally.  Gait normal with good balance and coordination.    MSK:  Non tender with full range of motion and good stability and symmetric strength and tone of shoulders,  wrist, hip, knee and ankles bilaterally.  Elbow: Right Unremarkable to inspection. No bruising noted Range of motion shows patient does have full range of motion but no longer has pain. Patient has stable vagus and valgus force. On palpation patient does have some fullness of the soft tissue compared to the contralateral side but didn't appear to less than previous exam. Ulnar nerve does not sublux. Negative cubital tunnel Tinel's. Contralateral elbow unremarkable  Musculoskeletal ultrasound was performed and interpreted by Charlann Boxer D.O.   Elbow: Right Lateral epicondyle and common extensor tendon origin visualized.  No edema, effusions, or avulsions seen.  Radial head unremarkable and located in annular ligament Medial epicondyle and common flexor tendon origin visualized. Patient though at the tuberosity and does have what appears to be a partial tear of the brachial radialis tendon but significantly less than previous exam.  Bicep tendon healed.  Ulnar nerve in cubital tunnel unremarkable. Olecranon and triceps insertion visualized and unremarkable without edema, effusion, or avulsion.  No signs olecranon bursitis. Power doppler signal normal.  IMPRESSION:  Partial brachial radialis tendon tear healing.     Impression and Recommendations:     This case required medical decision making of moderate complexity.

## 2014-06-01 ENCOUNTER — Ambulatory Visit (INDEPENDENT_AMBULATORY_CARE_PROVIDER_SITE_OTHER): Payer: Managed Care, Other (non HMO) | Admitting: Family Medicine

## 2014-06-01 ENCOUNTER — Encounter: Payer: Self-pay | Admitting: Family Medicine

## 2014-06-01 VITALS — BP 122/84 | HR 74 | Ht 69.0 in | Wt 297.0 lb

## 2014-06-01 DIAGNOSIS — S59911D Unspecified injury of right forearm, subsequent encounter: Secondary | ICD-10-CM

## 2014-06-01 DIAGNOSIS — Z5189 Encounter for other specified aftercare: Secondary | ICD-10-CM

## 2014-06-01 DIAGNOSIS — S59919A Unspecified injury of unspecified forearm, initial encounter: Secondary | ICD-10-CM

## 2014-06-01 DIAGNOSIS — S6990XA Unspecified injury of unspecified wrist, hand and finger(s), initial encounter: Secondary | ICD-10-CM

## 2014-06-01 DIAGNOSIS — S59909A Unspecified injury of unspecified elbow, initial encounter: Secondary | ICD-10-CM

## 2014-06-01 NOTE — Progress Notes (Signed)
  [facility] [address] [place], [place] [address] Phone: [phone] Subjective:     CC: right arm pain followup.  BEE:[id number] Patrick Gallagher is a 33 y.o. male coming in with complaint of right arm pain. Patient was seen previously and did have a tear of the brachial radialis tendon. Patient was given a counterstrain brace, icing protocol, anti-inflammatories, and discussed avoiding any heavy lifting. Patient has continued to increase his activity without any significant pain. Patient also states that he does have full range of motion of the elbow now which is very healthy with. Patient actually states that he is having no pain overall. Patient is not taking any medications regularly but takes it intermittently meloxicam.    Past medical history, social, surgical and family history all reviewed in electronic medical record.   Review of Systems: No headache, visual changes, nausea, vomiting, diarrhea, constipation, dizziness, abdominal pain, skin rash, fevers, chills, night sweats, weight loss, swollen lymph nodes, body aches, joint swelling, muscle aches, chest pain, shortness of breath, mood changes.   Objective Blood pressure 122/84, pulse 74, height 5\' 9"  (1.753 m), weight 297 lb (134.718 kg), SpO2 98.00%.  General: No apparent distress alert and oriented x3 mood and affect normal, dressed appropriately.  obese HEENT: Pupils equal, extraocular movements intact  Respiratory: Patient's speak in full sentences and does not appear short of breath  Cardiovascular: No lower extremity edema, non tender, no erythema  Skin: Warm dry intact with no signs of infection or rash on extremities or on axial skeleton.  Abdomen: Soft nontender  Neuro: Cranial nerves II through XII are intact, neurovascularly intact in all extremities with 2+ DTRs and 2+ pulses.  Lymph: No lymphadenopathy of posterior or anterior cervical chain or axillae bilaterally.  Gait normal  with good balance and coordination.  MSK:  Non tender with full range of motion and good stability and symmetric strength and tone of shoulders,  wrist, hip, knee and ankles bilaterally.  Elbow: Right Unremarkable to inspection. No bruising noted Range of motion is full with no restriction and patient has full strength.. Ulnar nerve does not sublux. Negative cubital tunnel Tinel's. Contralateral elbow unremarkable  Musculoskeletal ultrasound was performed and interpreted by Charlann Boxer D.O.   Elbow: Right Lateral epicondyle and common extensor tendon origin visualized.  No edema, effusions, or avulsions seen.  Radial head unremarkable and located in annular ligament Medial epicondyle and common flexor tendon origin visualized. The brachial radialis tendon seems to be completely healed at this time. Bicep tendon healed.  Ulnar nerve in cubital tunnel unremarkable. Olecranon and triceps insertion visualized and unremarkable without edema, effusion, or avulsion.  No signs olecranon bursitis. Power doppler signal normal.  IMPRESSION: Healed brachial radialis tendon tear     Impression and Recommendations:     This case required medical decision making of moderate complexity.

## 2014-06-01 NOTE — Patient Instructions (Signed)
Good to see you You are doing great.  You are ok to do whatever you want. Patrick Gallagher is still your bet friend See me when you need me

## 2014-06-01 NOTE — Assessment & Plan Note (Signed)
Patient is doing remarkably well at this time. Patient is encouraged to do any activity he feels fit. Patient will continue with icing and can consider compression as needed. Patient will then follow up with me on an as-needed basis.

## 2016-03-23 ENCOUNTER — Encounter: Payer: Self-pay | Admitting: Gastroenterology

## 2016-05-14 DIAGNOSIS — D126 Benign neoplasm of colon, unspecified: Secondary | ICD-10-CM

## 2016-05-14 HISTORY — DX: Benign neoplasm of colon, unspecified: D12.6

## 2016-05-20 ENCOUNTER — Ambulatory Visit (INDEPENDENT_AMBULATORY_CARE_PROVIDER_SITE_OTHER): Payer: Self-pay | Admitting: Gastroenterology

## 2016-05-20 ENCOUNTER — Encounter: Payer: Self-pay | Admitting: Gastroenterology

## 2016-05-20 VITALS — BP 110/70 | HR 82 | Ht 68.0 in | Wt 351.0 lb

## 2016-05-20 DIAGNOSIS — Z800 Family history of malignant neoplasm of digestive organs: Secondary | ICD-10-CM

## 2016-05-20 MED ORDER — NA SULFATE-K SULFATE-MG SULF 17.5-3.13-1.6 GM/177ML PO SOLN: 1.0000 | Freq: Once | ORAL | Status: DC

## 2016-05-20 NOTE — Patient Instructions (Signed)
You have been scheduled for a colonoscopy. Please follow written instructions given to you at your visit today.  Please pick up your prep supplies at the pharmacy within the next 1-3 days. If you use inhalers (even only as needed), please bring them with you on the day of your procedure. Your physician has requested that you go to www.startemmi.com and enter the access code given to you at your visit today. This web site gives a general overview about your procedure. However, you should still follow specific instructions given to you by our office regarding your preparation for the procedure.  Normal BMI (Body Mass Index- based on height and weight) is between 19 and 25. Your BMI today is Body mass index is 53.38 kg/(m^2). Marland Kitchen Please consider follow up  regarding your BMI with your Primary Care Provider.  Thank you for choosing me and [facility].  Pricilla Riffle. Dagoberto Ligas., MD., Marval Regal

## 2016-05-20 NOTE — Progress Notes (Signed)
    History of Present Illness: This is a 35 year old male referred by Patrick Sanders, MD for the evaluation of a family history of colon cancer. His father was diagnosed with colon cancer at age 91. No other relatives with colorectal cancer. He has no gastrointestinal complaints. He has not previously had colorectal cancer screening. Denies weight loss, abdominal pain, constipation, diarrhea, change in stool caliber, melena, hematochezia, nausea, vomiting, dysphagia, reflux symptoms, chest pain.   Review of Systems: Pertinent positive and negative review of systems were noted in the above HPI section. All other review of systems were otherwise negative.  Current Medications, Allergies, Past Medical History, Past Surgical History, Family History and Social History were reviewed in [employer] record.  Physical Exam: General: Well developed, well nourished, no acute distress Head: Normocephalic and atraumatic Eyes:  sclerae anicteric, EOMI Ears: Normal auditory acuity Mouth: No deformity or lesions Neck: Supple, no masses or thyromegaly Lungs: Clear throughout to auscultation Heart: Regular rate and rhythm; no murmurs, rubs or bruits Abdomen: Soft, non tender and non distended. No masses, hepatosplenomegaly or hernias noted. Normal Bowel sounds Rectal: deferred to colonoscopy  Musculoskeletal: Symmetrical with no gross deformities  Skin: No lesions on visible extremities Pulses:  Normal pulses noted Extremities: No clubbing, cyanosis, edema or deformities noted Neurological: Alert oriented x 4, grossly nonfocal Cervical Nodes:  No significant cervical adenopathy Inguinal Nodes: No significant inguinal adenopathy Psychological:  Alert and cooperative. Normal mood and affect  Assessment and Recommendations:  1. History of colon cancer, father at age 24. Schedule colonoscopy. The risks (including bleeding, perforation, infection, missed lesions, medication reactions  and possible hospitalization or surgery if complications occur), benefits, and alternatives to colonoscopy with possible biopsy and possible polypectomy were discussed with the patient and they consent to proceed.   2.  Morbid obesity. Weight loss program recommended. Consider bariatric surgery.   cc: Patrick Sanders, MD [address] [place], [place] [address]

## 2016-06-02 ENCOUNTER — Encounter (HOSPITAL_COMMUNITY): Payer: Self-pay | Admitting: *Deleted

## 2016-06-09 ENCOUNTER — Ambulatory Visit (HOSPITAL_COMMUNITY): Payer: 59 | Admitting: Anesthesiology

## 2016-06-09 ENCOUNTER — Encounter (HOSPITAL_COMMUNITY): Payer: Self-pay | Admitting: *Deleted

## 2016-06-09 ENCOUNTER — Ambulatory Visit (HOSPITAL_COMMUNITY)
Admission: RE | Admit: 2016-06-09 | Discharge: 2016-06-09 | Disposition: A | Discharge status: Home / Self Care | Payer: 59 | Source: Ambulatory Visit | Attending: Gastroenterology | Admitting: Gastroenterology

## 2016-06-09 ENCOUNTER — Encounter (HOSPITAL_COMMUNITY)
Admission: RE | Disposition: A | Discharge status: Home / Self Care | Payer: Self-pay | Source: Ambulatory Visit | Attending: Gastroenterology

## 2016-06-09 DIAGNOSIS — Z800 Family history of malignant neoplasm of digestive organs: Secondary | ICD-10-CM | POA: Diagnosis not present

## 2016-06-09 DIAGNOSIS — Z1211 Encounter for screening for malignant neoplasm of colon: Secondary | ICD-10-CM | POA: Diagnosis not present

## 2016-06-09 DIAGNOSIS — Z6841 Body Mass Index (BMI) 40.0 and over, adult: Secondary | ICD-10-CM | POA: Insufficient documentation

## 2016-06-09 DIAGNOSIS — D125 Benign neoplasm of sigmoid colon: Secondary | ICD-10-CM | POA: Insufficient documentation

## 2016-06-09 HISTORY — PX: COLONOSCOPY WITH PROPOFOL: SHX5780

## 2016-06-09 SURGERY — COLONOSCOPY WITH PROPOFOL
Anesthesia: Monitor Anesthesia Care

## 2016-06-09 MED ORDER — LIDOCAINE HCL (CARDIAC) 20 MG/ML IV SOLN
INTRAVENOUS | Status: DC | PRN
  Administered 2016-06-09: 50 mg via INTRAVENOUS

## 2016-06-09 MED ORDER — PROPOFOL 500 MG/50ML IV EMUL
INTRAVENOUS | Status: DC | PRN
  Administered 2016-06-09: 100 ug/kg/min via INTRAVENOUS

## 2016-06-09 MED ORDER — LIDOCAINE HCL (CARDIAC) 20 MG/ML IV SOLN
INTRAVENOUS | Status: AC
  Filled 2016-06-09: qty 5

## 2016-06-09 MED ORDER — LACTATED RINGERS IV SOLN
INTRAVENOUS | Status: DC
  Administered 2016-06-09: 1000 mL via INTRAVENOUS

## 2016-06-09 MED ORDER — KETAMINE HCL 10 MG/ML IJ SOLN
INTRAMUSCULAR | Status: DC | PRN
  Administered 2016-06-09: 25 mg via INTRAVENOUS

## 2016-06-09 MED ORDER — PROPOFOL 10 MG/ML IV BOLUS
INTRAVENOUS | Status: AC
  Filled 2016-06-09: qty 40

## 2016-06-09 MED ORDER — PROPOFOL 10 MG/ML IV BOLUS
INTRAVENOUS | Status: AC
  Filled 2016-06-09: qty 20

## 2016-06-09 MED ORDER — PROPOFOL 10 MG/ML IV BOLUS
INTRAVENOUS | Status: DC | PRN
  Administered 2016-06-09 (×2): 20 mg via INTRAVENOUS
  Administered 2016-06-09: 50 mg via INTRAVENOUS
  Administered 2016-06-09: 20 mg via INTRAVENOUS

## 2016-06-09 MED ORDER — SODIUM CHLORIDE 0.9 % IV SOLN: INTRAVENOUS | Status: DC

## 2016-06-09 MED ORDER — KETAMINE HCL 10 MG/ML IJ SOLN
INTRAMUSCULAR | Status: AC
  Filled 2016-06-09: qty 1

## 2016-06-09 SURGICAL SUPPLY — 22 items

## 2016-06-09 NOTE — Anesthesia Postprocedure Evaluation (Signed)
Anesthesia Post Note  Patient: Patrick Gallagher  Procedure(s) Performed: Procedure(s) (LRB): COLONOSCOPY WITH PROPOFOL (N/A)  Patient location during evaluation: PACU Anesthesia Type: MAC Level of consciousness: awake and alert Pain management: pain level controlled Vital Signs Assessment: post-procedure vital signs reviewed and stable Respiratory status: spontaneous breathing, nonlabored ventilation, respiratory function stable and patient connected to nasal cannula oxygen Cardiovascular status: stable and blood pressure returned to baseline Anesthetic complications: no    Last Vitals:  Filed Vitals:   06/09/16 1015 06/09/16 1019  BP:  113/70  Pulse: 73 72  Temp:    Resp: 20 12    Last Pain: There were no vitals filed for this visit.               Riccardo Dubin

## 2016-06-09 NOTE — H&P (View-Only) (Signed)
    History of Present Illness: This is a 35 year old male referred by Patrick Sanders, MD for the evaluation of a family history of colon cancer. His father was diagnosed with colon cancer at age 29. No other relatives with colorectal cancer. He has no gastrointestinal complaints. He has not previously had colorectal cancer screening. Denies weight loss, abdominal pain, constipation, diarrhea, change in stool caliber, melena, hematochezia, nausea, vomiting, dysphagia, reflux symptoms, chest pain.   Review of Systems: Pertinent positive and negative review of systems were noted in the above HPI section. All other review of systems were otherwise negative.  Current Medications, Allergies, Past Medical History, Past Surgical History, Family History and Social History were reviewed in [employer] record.  Physical Exam: General: Well developed, well nourished, no acute distress Head: Normocephalic and atraumatic Eyes:  sclerae anicteric, EOMI Ears: Normal auditory acuity Mouth: No deformity or lesions Neck: Supple, no masses or thyromegaly Lungs: Clear throughout to auscultation Heart: Regular rate and rhythm; no murmurs, rubs or bruits Abdomen: Soft, non tender and non distended. No masses, hepatosplenomegaly or hernias noted. Normal Bowel sounds Rectal: deferred to colonoscopy  Musculoskeletal: Symmetrical with no gross deformities  Skin: No lesions on visible extremities Pulses:  Normal pulses noted Extremities: No clubbing, cyanosis, edema or deformities noted Neurological: Alert oriented x 4, grossly nonfocal Cervical Nodes:  No significant cervical adenopathy Inguinal Nodes: No significant inguinal adenopathy Psychological:  Alert and cooperative. Normal mood and affect  Assessment and Recommendations:  1. History of colon cancer, father at age 74. Schedule colonoscopy. The risks (including bleeding, perforation, infection, missed lesions, medication reactions  and possible hospitalization or surgery if complications occur), benefits, and alternatives to colonoscopy with possible biopsy and possible polypectomy were discussed with the patient and they consent to proceed.   2.  Morbid obesity. Weight loss program recommended. Consider bariatric surgery.   cc: Patrick Sanders, MD [address] [place], [place] [address]

## 2016-06-09 NOTE — Anesthesia Preprocedure Evaluation (Addendum)
Anesthesia Evaluation  Patient identified by MRN, date of birth, ID band Patient awake    Reviewed: Allergy & Precautions, H&P , Patient's Chart, lab work & pertinent test results, reviewed documented beta blocker date and time   Airway Mallampati: III  TM Distance: >3 FB Neck ROM: full    Dental no notable dental hx.    Pulmonary    Pulmonary exam normal breath sounds clear to auscultation       Cardiovascular  Rhythm:regular Rate:Normal     Neuro/Psych    GI/Hepatic   Endo/Other  Morbid obesity  Renal/GU      Musculoskeletal   Abdominal   Peds  Hematology   Anesthesia Other Findings   Reproductive/Obstetrics                            Anesthesia Physical Anesthesia Plan  ASA: III  Anesthesia Plan: MAC   Post-op Pain Management:    Induction: Intravenous  Airway Management Planned: Mask and Natural Airway  Additional Equipment:   Intra-op Plan:   Post-operative Plan:   Informed Consent: I have reviewed the patients History and Physical, chart, labs and discussed the procedure including the risks, benefits and alternatives for the proposed anesthesia with the patient or authorized representative who has indicated his/her understanding and acceptance.   Dental Advisory Given  Plan Discussed with: CRNA and Surgeon  Anesthesia Plan Comments: (Discussed sedation and potential to need to place airway or ETT if warranted by clinical changes intra-operatively. We will start procedure as MAC.)        Anesthesia Quick Evaluation

## 2016-06-09 NOTE — Transfer of Care (Signed)
Immediate Anesthesia Transfer of Care Note  Patient: Patrick Gallagher  Procedure(s) Performed: Procedure(s): COLONOSCOPY WITH PROPOFOL (N/A)  Patient Location: PACU  Anesthesia Type:MAC  Level of Consciousness:  sedated, patient cooperative and responds to stimulation  Airway & Oxygen Therapy:Patient Spontanous Breathing and Patient connected to face mask oxgen  Post-op Assessment:  Report given to PACU RN and Post -op Vital signs reviewed and stable  Post vital signs:  Reviewed and stable  Last Vitals:  Filed Vitals:   06/09/16 0824  BP: 142/85  Pulse: 80  Temp: 36.6 C  Resp: 16    Complications: No apparent anesthesia complications

## 2016-06-09 NOTE — Interval H&P Note (Signed)
History and Physical Interval Note:  06/09/2016 8:45 AM  Patrick Gallagher  has presented today for surgery, with the diagnosis of Family Hx of colon cancer, BMI 56  The various methods of treatment have been discussed with the patient and family. After consideration of risks, benefits and other options for treatment, the patient has consented to  Procedure(s): COLONOSCOPY WITH PROPOFOL (N/A) as a surgical intervention .  The patient's history has been reviewed, patient examined, no change in status, stable for surgery.  I have reviewed the patient's chart and labs.  Questions were answered to the patient's satisfaction.     Pricilla Riffle. Fuller Plan

## 2016-06-09 NOTE — Op Note (Signed)
[facility] Patient Name: Patrick Gallagher Procedure Date: 06/09/2016 MRN: [id number] Attending MD: Ladene Artist , MD Date of Birth: July 24, 1981 CSN: [id number] Age: 35 Admit Type: Outpatient Procedure:                Colonoscopy Indications:              Screening in patient at increased risk: Family                            history of 1st-degree relative with colorectal                            cancer before age 30 years Providers:                Pricilla Riffle. Fuller Plan, MD, Cleda Daub, RN, Elspeth Cho, Technician, Delena Bali, CRNA Referring MD:             Jinny Sanders, MD Medicines:                Monitored Anesthesia Care Complications:            No immediate complications. Estimated blood loss:                            None. Estimated Blood Loss:     Estimated blood loss: none. Procedure:                Pre-Anesthesia Assessment:                           - Prior to the procedure, a History and Physical                            was performed, and patient medications and                            allergies were reviewed. The patient's tolerance of                            previous anesthesia was also reviewed. The risks                            and benefits of the procedure and the sedation                            options and risks were discussed with the patient.                            All questions were answered, and informed consent                            was obtained. Prior Anticoagulants: The patient has  taken no previous anticoagulant or antiplatelet                            agents. ASA Grade Assessment: II - A patient with                            mild systemic disease. After reviewing the risks                            and benefits, the patient was deemed in                            satisfactory condition to undergo the procedure.                           After  obtaining informed consent, the colonoscope                            was passed under direct vision. Throughout the                            procedure, the patient's blood pressure, pulse, and                            oxygen saturations were monitored continuously. The                            [id number] scope was introduced through                            the anus and advanced to the the cecum, identified                            by appendiceal orifice and ileocecal valve. The                            ileocecal valve, appendiceal orifice, and rectum                            were photographed. The quality of the bowel                            preparation was excellent. The colonoscopy was                            performed without difficulty. The patient tolerated                            the procedure well. Scope In: 9:35:25 AM Scope Out: 9:49:41 AM Scope Withdrawal Time: 0 hours 11 minutes 4 seconds  Total Procedure Duration: 0 hours 14 minutes 16 seconds  Findings:      A 6 mm polyp was found in the sigmoid colon. The polyp was       semi-pedunculated. The polyp was  removed with a cold snare. Resection       and retrieval were complete.      The exam was otherwise without abnormality on direct and retroflexion       views. Impression:               - One 6 mm polyp in the sigmoid colon, removed with                            a cold snare. Resected and retrieved.                           - The examination was otherwise normal on direct                            and retroflexion views. Moderate Sedation:      N/A- Per Anesthesia Care Recommendation:           - Repeat colonoscopy in 5 years for surveillance.                           - Patient has a contact number available for                            emergencies. The signs and symptoms of potential                            delayed complications were discussed with the                             patient. Return to normal activities tomorrow.                            Written discharge instructions were provided to the                            patient.                           - Resume previous diet.                           - Continue present medications.                           - Await pathology results. Procedure Code(s):        --- Professional ---                           [phone], Colonoscopy, flexible; with removal of                            tumor(s), polyp(s), or other lesion(s) by snare                            technique Diagnosis Code(s):        --- Professional ---  Z80.0, Family history of malignant neoplasm of                            digestive organs                           D12.5, Benign neoplasm of sigmoid colon CPT copyright 2016 American Medical Association. All rights reserved. The codes documented in this report are preliminary and upon coder review may  be revised to meet current compliance requirements. Ladene Artist, MD 06/09/2016 9:57:35 AM This report has been signed electronically. Number of Addenda: 0

## 2016-06-09 NOTE — Discharge Instructions (Signed)

## 2016-06-10 ENCOUNTER — Encounter: Payer: Self-pay | Admitting: Gastroenterology

## 2016-06-10 ENCOUNTER — Encounter (HOSPITAL_COMMUNITY): Payer: Self-pay | Admitting: Gastroenterology

## 2018-09-28 ENCOUNTER — Other Ambulatory Visit: Payer: Self-pay | Admitting: Podiatry

## 2018-09-28 ENCOUNTER — Ambulatory Visit (INDEPENDENT_AMBULATORY_CARE_PROVIDER_SITE_OTHER): Payer: 59

## 2018-09-28 ENCOUNTER — Encounter: Payer: Self-pay | Admitting: Podiatry

## 2018-09-28 ENCOUNTER — Ambulatory Visit (INDEPENDENT_AMBULATORY_CARE_PROVIDER_SITE_OTHER): Payer: 59 | Admitting: Podiatry

## 2018-09-28 DIAGNOSIS — M722 Plantar fascial fibromatosis: Secondary | ICD-10-CM

## 2018-09-28 DIAGNOSIS — M79671 Pain in right foot: Secondary | ICD-10-CM

## 2018-09-28 DIAGNOSIS — M79672 Pain in left foot: Secondary | ICD-10-CM

## 2018-09-28 DIAGNOSIS — M7662 Achilles tendinitis, left leg: Secondary | ICD-10-CM

## 2018-09-28 MED ORDER — TRIAMCINOLONE ACETONIDE 10 MG/ML IJ SUSP
10.0000 mg | Freq: Once | INTRAMUSCULAR | Status: AC
  Administered 2018-09-28: 10 mg

## 2018-09-28 MED ORDER — DICLOFENAC SODIUM 75 MG PO TBEC: 75.0000 mg | DELAYED_RELEASE_TABLET | Freq: Two times a day (BID) | ORAL | 2 refills | Status: DC

## 2018-09-28 NOTE — Patient Instructions (Signed)

## 2018-09-28 NOTE — Progress Notes (Signed)
Subjective:   Patient ID: Patrick Gallagher, male   DOB: 37 y.o.   MRN: [id number]   HPI Patient presents with exquisite discomfort plantar aspect right heel several months duration and longer-term chronic pain of the posterior heel left that he states is becoming more tender recently.  Patient is obese which is complicating factor and does walk some with work and states the pain is gradually becoming more intense for him over this period of time.  Patient does not smoke and likes to be active   Review of Systems  All other systems reviewed and are negative.       Objective:  Physical Exam  Constitutional: He appears well-developed and well-nourished.  Cardiovascular: Intact distal pulses.  Pulmonary/Chest: Effort normal.  Musculoskeletal: Normal range of motion.  Neurological: He is alert.  Skin: Skin is warm.  Nursing note and vitals reviewed.   Neurovascular status found to be intact muscle strength is adequate range of motion within normal limits with exquisite discomfort plantar aspect right heel at the insertional point tendon calcaneus with posterior pain on the lateral side of the heel with a central medial band good and minimal equinus condition noted with patient found to have good digital perfusion and well oriented x3     Assessment:  Acute plantar fasciitis right with inflammation and Achilles tendinitis left     Plan:  H&P x-rays reviewed education concerning conditions were rendered.  Today I injected the plantar fascia right 3 mg Kenalog 5 Milgram Xylocaine and explained for the left the possibility for rupture associate with Achilles tendon injection patient willing to accept risk and I did a careful injection of the lateral side 3 mg Dexasone Kenalog 5 Milgram Xylocaine not directly into the tendon after sterile prep.  Applied fascial brace right placed on diclofenac 75 mg twice daily gave instructions for physical therapy and reappoint to recheck  X-ray indicates  that there is small posterior spur left heel with moderate depression of the arch bilateral

## 2018-10-19 ENCOUNTER — Ambulatory Visit (INDEPENDENT_AMBULATORY_CARE_PROVIDER_SITE_OTHER): Payer: 59 | Admitting: Podiatry

## 2018-10-19 ENCOUNTER — Encounter: Payer: Self-pay | Admitting: Podiatry

## 2018-10-19 DIAGNOSIS — M7662 Achilles tendinitis, left leg: Secondary | ICD-10-CM | POA: Diagnosis not present

## 2018-10-19 DIAGNOSIS — M722 Plantar fascial fibromatosis: Secondary | ICD-10-CM

## 2018-10-19 NOTE — Progress Notes (Signed)
Subjective:   Patient ID: Patrick Gallagher, male   DOB: 37 y.o.   MRN: [id number]   HPI Patient states significantly improved with significant reduction of the discomfort after treatment   ROS      Objective:  Physical Exam  Neurovascular status intact with patient having significant reduction of discomfort right and left foot with no pain noted upon palpation     Assessment:  Patient is doing much better after having fasciitis and Achilles tendinitis treated with no indications of pathology currently with obesity is complicating factor     Plan:  Reviewed condition discussed possible orthotics in future and at this point I recommended continued compression elevation immobilization and he will be seen back as needed and may require further treatments

## 2018-12-20 ENCOUNTER — Other Ambulatory Visit: Payer: Self-pay | Admitting: Podiatry

## 2018-12-20 MED ORDER — DICLOFENAC SODIUM 75 MG PO TBEC: 75.0000 mg | DELAYED_RELEASE_TABLET | Freq: Two times a day (BID) | ORAL | 1 refills | Status: DC

## 2019-01-11 ENCOUNTER — Encounter: Payer: Self-pay | Admitting: Podiatry

## 2019-01-11 ENCOUNTER — Ambulatory Visit (INDEPENDENT_AMBULATORY_CARE_PROVIDER_SITE_OTHER): Payer: Commercial Managed Care - PPO | Admitting: Podiatry

## 2019-01-11 DIAGNOSIS — M7662 Achilles tendinitis, left leg: Secondary | ICD-10-CM

## 2019-01-11 DIAGNOSIS — M722 Plantar fascial fibromatosis: Secondary | ICD-10-CM | POA: Diagnosis not present

## 2019-01-11 MED ORDER — TRIAMCINOLONE ACETONIDE 10 MG/ML IJ SUSP
10.0000 mg | Freq: Once | INTRAMUSCULAR | Status: AC
  Administered 2019-01-11: 10 mg

## 2019-01-11 NOTE — Progress Notes (Signed)
Subjective:   Patient ID: Patrick Gallagher, male   DOB: 38 y.o.   MRN: [id number]   HPI Patient states the left back of the heel has started to flareup again and he had been playing basketball and the right bottom of the heel bothers him but not to the same degree.  Patient states he is starting to work on his weight but admits he has not lost any weight at this current time   ROS      Objective:  Physical Exam  Neurovascular status intact with discomfort posterior lateral aspect left heel at the insertion of the Achilles and mild to moderate discomfort plantar heel right with depression of the arch     Assessment:  Achilles tendinitis left lateral side with inflammation fluid buildup and plantar fasciitis right of a mild nature with depression of the arch     Plan:  H&P condition reviewed and at this point for the left I did a sterile prep and then injected the Achilles after first explaining the chances for rupture with 3 mg Kenalog dexamethasone 5 mg Xylocaine into the lateral side staying away from the central and medial side.  For the right I educated him on plantar fasciitis discussed orthotics which we may consider in future

## 2019-01-16 ENCOUNTER — Encounter: Payer: Self-pay | Admitting: Podiatrist

## 2019-01-16 ENCOUNTER — Ambulatory Visit (INDEPENDENT_AMBULATORY_CARE_PROVIDER_SITE_OTHER): Payer: Commercial Managed Care - PPO | Admitting: Podiatrist

## 2019-01-16 DIAGNOSIS — M722 Plantar fascial fibromatosis: Secondary | ICD-10-CM | POA: Diagnosis not present

## 2019-01-16 MED ORDER — TRIAMCINOLONE ACETONIDE 10 MG/ML IJ SUSP
10.0000 mg | Freq: Once | INTRAMUSCULAR | Status: AC
  Administered 2019-01-16: 10 mg

## 2019-01-17 NOTE — Progress Notes (Signed)
Chief Complaint  Patient presents with  . Plantar Fasciitis    Pt states flare in right plantar heel since 1 day ago, requests injection.  . Tendonitis    Left achilles tendonitis f/u. Pt states injection very effective, reduced pain by 90%.     HPI: Patient is 38 y.o. male who presents today for right plantar heel pain follow up-  He is going to [employer] on a work trip and would like another injection.     No Known Allergies  Physical Exam  Patient is awake, alert, and oriented x 3.  In no acute distress.  Vascular status is intact with palpable pedal pulses at 2/4 DP and PT bilateral and capillary refill time within normal limits. Neurological sensation is also intact bilaterally via Semmes Weinstein monofilament at 5/5 sites. Light touch, vibratory sensation, Achilles tendon reflex is intact. Dermatological exam reveals skin color, turger and texture as normal. No open lesions present.  Musculature intact with dorsiflexion, plantarflexion, inversion, eversion. Flat foot deformity noted right foot.  Some pain at the medial calcaneal turbercle noted consistent with plantar fasciitis. Left achilles insertional pain is resolving following his previous visit  Assessment: plantar fasciitis right foot, resolving achilles tendonitis left foot  Plan: Discussed treatment options and at this time a plantar fascial injection was recommended.  The patient agreed and a sterile skin prep was applied.  An injection consisting of kenalog and marcaine mixture was infiltrated at the point of maximal tenderness on the right Heel. A arch taping was also applied for his upcoming trip.  He will see Dr. Paulla Dolly for continued care.

## 2019-02-08 ENCOUNTER — Ambulatory Visit (INDEPENDENT_AMBULATORY_CARE_PROVIDER_SITE_OTHER): Payer: Commercial Managed Care - PPO | Admitting: Podiatry

## 2019-02-08 ENCOUNTER — Encounter: Payer: Self-pay | Admitting: Podiatry

## 2019-02-08 DIAGNOSIS — M722 Plantar fascial fibromatosis: Secondary | ICD-10-CM

## 2019-02-08 DIAGNOSIS — M7662 Achilles tendinitis, left leg: Secondary | ICD-10-CM | POA: Diagnosis not present

## 2019-02-08 NOTE — Progress Notes (Signed)
Subjective:   Patient ID: Patrick Gallagher, male   DOB: 38 y.o.   MRN: [id number]   HPI Patient states he is improved with pain still present with activity and is trying to lose weight currently   ROS      Objective:  Physical Exam  Neurovascular status intact muscle strength is adequate with patient found to have discomfort in the plantar fascial right posterior heel left that is improved but present with severe foot structural changes bilateral     Assessment:  Chronic fasciitis Achilles tendinitis bilateral with obesity is complicating factor     Plan:  H&P condition reviewed and I have recommended orthotics for this patient and I am referring to ped orthotist for orthotics due to the complexity of his feet.  I do think heel with will be necessary bilateral and a more rigid type orthotic most likely with heavy cushioning due to obesity.  Patient will be seen back for [place] to recheck again for this is to occur and is scheduled with ped orthotist

## 2019-03-02 ENCOUNTER — Other Ambulatory Visit: Payer: Self-pay | Admitting: Podiatry

## 2019-03-02 ENCOUNTER — Other Ambulatory Visit: Payer: Commercial Managed Care - PPO | Admitting: Orthotics

## 2019-05-18 ENCOUNTER — Other Ambulatory Visit: Payer: Self-pay | Admitting: Podiatry

## 2019-06-30 ENCOUNTER — Other Ambulatory Visit: Payer: Self-pay

## 2019-06-30 ENCOUNTER — Ambulatory Visit (INDEPENDENT_AMBULATORY_CARE_PROVIDER_SITE_OTHER): Payer: Commercial Managed Care - PPO | Admitting: Podiatry

## 2019-06-30 ENCOUNTER — Encounter: Payer: Self-pay | Admitting: Podiatry

## 2019-06-30 VITALS — Temp 97.8°F

## 2019-06-30 DIAGNOSIS — M7662 Achilles tendinitis, left leg: Secondary | ICD-10-CM

## 2019-07-05 NOTE — Progress Notes (Signed)
Subjective:   Patient ID: Patrick Gallagher, male   DOB: 38 y.o.   MRN: [id number]   HPI Patient presents stating his left Achilles tendon has really started to bother him again and is been approximately 5 months.  Last time it was the right now is the left and states that he did overdo it and knows how he created this.  Understands obesity is a big part of his problem   ROS      Objective:  Physical Exam  Posterior Achilles tendinitis left is quite inflamed on the lateral side of the central medial not involved     Assessment:  Acute Achilles tendinitis left with inflammation     Plan:  H&P and discussed condition and at this point I did discuss the risk of injection treatment.  Patient wants to pursue this and at this point I did carefully do a sterile prep of the lateral side injected the Achilles 3 mg Dexasone Kenalog 5 mg Xylocaine keep it away from the central and medial portion of the tendon.  Will be seen back and ultimately may require other treatment but does understand obesity is a complicating factor to anything working to be able to do

## 2019-08-18 ENCOUNTER — Encounter: Payer: Self-pay | Admitting: Gastroenterology

## 2019-08-31 ENCOUNTER — Other Ambulatory Visit: Payer: Self-pay | Admitting: Family Medicine

## 2019-08-31 DIAGNOSIS — N6310 Unspecified lump in the right breast, unspecified quadrant: Secondary | ICD-10-CM

## 2019-08-31 DIAGNOSIS — N63 Unspecified lump in unspecified breast: Secondary | ICD-10-CM

## 2019-09-04 ENCOUNTER — Ambulatory Visit: Payer: 59 | Admitting: Family Medicine

## 2019-09-07 ENCOUNTER — Other Ambulatory Visit: Payer: Self-pay

## 2019-09-07 DIAGNOSIS — Z20822 Contact with and (suspected) exposure to covid-19: Secondary | ICD-10-CM

## 2019-09-08 LAB — NOVEL CORONAVIRUS, NAA: SARS-CoV-2, NAA: NOT DETECTED

## 2019-09-14 ENCOUNTER — Other Ambulatory Visit: Payer: Self-pay

## 2019-09-14 DIAGNOSIS — Z20822 Contact with and (suspected) exposure to covid-19: Secondary | ICD-10-CM

## 2019-09-15 LAB — NOVEL CORONAVIRUS, NAA: SARS-CoV-2, NAA: NOT DETECTED

## 2019-09-21 ENCOUNTER — Encounter: Payer: Self-pay | Admitting: Gastroenterology

## 2019-09-21 ENCOUNTER — Other Ambulatory Visit: Payer: Self-pay

## 2019-09-21 ENCOUNTER — Ambulatory Visit (INDEPENDENT_AMBULATORY_CARE_PROVIDER_SITE_OTHER): Payer: Commercial Managed Care - PPO | Admitting: Gastroenterology

## 2019-09-21 VITALS — BP 110/70 | HR 91 | Temp 98.3°F | Ht 68.0 in | Wt 387.0 lb

## 2019-09-21 DIAGNOSIS — Z86010 Personal history of colonic polyps: Secondary | ICD-10-CM

## 2019-09-21 DIAGNOSIS — Z800 Family history of malignant neoplasm of digestive organs: Secondary | ICD-10-CM

## 2019-09-21 MED ORDER — NA SULFATE-K SULFATE-MG SULF 17.5-3.13-1.6 GM/177ML PO SOLN: 1.0000 | Freq: Once | ORAL | 0 refills | Status: AC

## 2019-09-21 NOTE — Patient Instructions (Signed)
Due to recent COVID-19 restrictions implemented by our local and state authorities and in an effort to keep both patients and staff as safe as possible, our hospital system now requires COVID-19 testing prior to any scheduled hospital procedure. Please go to our [facility] location drive thru testing site ([address] [place], [place] [address]) on 10/27/19 at  9:00am. There will be multiple testing areas, the first checkpoint being for pre-procedure/surgery testing. Get into the right (yellow) lane that leads to the PAT testing team. You will not be billed at the time of testing but may receive a bill later depending on your insurance. The approximate cost of the test is $100. You must agree to quarantine from the time of your testing until the procedure date on 10/31/19 . This should include staying at home with ONLY the people you live with. Avoid take-out, grocery store shopping or leaving the house for any non-emergent reason. Failure to have your COVID-19 test done on the date and time you have been scheduled will result in cancellation of procedure. Please call our office at [phone] if you have any questions.   You have been scheduled for a colonoscopy. Please follow written instructions given to you at your visit today.  Please pick up your prep supplies at the pharmacy within the next 1-3 days. If you use inhalers (even only as needed), please bring them with you on the day of your procedure.  Thank you for choosing me and [facility].  Pricilla Riffle. Dagoberto Ligas., MD., Marval Regal

## 2019-09-21 NOTE — Progress Notes (Signed)
History of Present Illness: This is a 38 year old male with a family history of colon cancer in his father at about age 2.  He underwent colonoscopy in June 2017 with findings as below.  He has no gastrointestinal complaints.  He is very concerned about his elevated risk of colorectal neoplasms and colon cancer and wishes to proceed with colonoscopy at 3-year interval. Denies weight loss, abdominal pain, constipation, diarrhea, change in stool caliber, melena, hematochezia, nausea, vomiting, dysphagia, reflux symptoms, chest pain.  Colonoscopy 05/2016 - One 6 mm polyp in the sigmoid colon, removed with a cold snare. Resected and Retrieved. Tubular adenoma  - The examination was otherwise normal on direct and retroflexion views.   No Known Allergies Outpatient Medications Prior to Visit  Medication Sig Dispense Refill  . diclofenac (VOLTAREN) 75 MG EC tablet TAKE 1 TABLET BY MOUTH TWICE A DAY (Patient not taking: Reported on 09/21/2019) 60 tablet 1   No facility-administered medications prior to visit.    Past Medical History:  Diagnosis Date  . Tendon injury    right forearm bicep  . Tubular adenoma of colon 05/2016   Past Surgical History:  Procedure Laterality Date  . COLONOSCOPY WITH PROPOFOL N/A 06/09/2016   Procedure: COLONOSCOPY WITH PROPOFOL;  Surgeon: Ladene Artist, MD;  Location: [facility];  Service: Endoscopy;  Laterality: N/A;  . URETHRAL STRICTURE DILATATION  09/08   repaired x 4. last one 09/08   Social History   Socioeconomic History  . Marital status: Single    Spouse name: Not on file  . Number of children: 3  . Years of education: Not on file  . Highest education level: Not on file  Occupational History  . Occupation: [employer]  Social Needs  . Financial resource strain: Not on file  . Food insecurity    Worry: Not on file    Inability: Not on file  . Transportation needs    Medical: Not on file    Non-medical: Not on file  Tobacco Use  . Smoking  status: Never Smoker  . Smokeless tobacco: Never Used  Substance and Sexual Activity  . Alcohol use: No    Alcohol/week: 0.0 standard drinks    Comment: one beer every 4-5 months  . Drug use: No  . Sexual activity: Not on file  Lifestyle  . Physical activity    Days per week: Not on file    Minutes per session: Not on file  . Stress: Not on file  Relationships  . Social Herbalist on phone: Not on file    Gets together: Not on file    Attends religious service: Not on file    Active member of club or organization: Not on file    Attends meetings of clubs or organizations: Not on file    Relationship status: Not on file  Other Topics Concern  . Not on file  Social History Narrative  . Not on file   Family History  Problem Relation Age of Onset  . Colon cancer Father   . High Cholesterol Father   . Hypertension Father   . Melanoma Sister   . Heart disease Child        x 3    ROS: as in HPI otherwise all systems negative   Physical Exam: General: Well developed, well nourished, no acute distress Head: Normocephalic and atraumatic Eyes:  sclerae anicteric, EOMI Ears: Normal auditory acuity Mouth: No deformity or lesions Lungs: Clear throughout  to auscultation Heart: Regular rate and rhythm; no murmurs, rubs or bruits Abdomen: Soft, non tender and non distended. No masses, hepatosplenomegaly or hernias noted. Normal Bowel sounds Rectal: Deferred to colonoscopy  Musculoskeletal: Symmetrical with no gross deformities  Pulses:  Normal pulses noted Extremities: No clubbing, cyanosis, edema or deformities noted Neurological: Alert oriented x 4, grossly nonfocal Psychological:  Alert and cooperative. Normal mood and affect   Assessment and Recommendations:  1.  Family history of colon cancer in his father at about age 86. Personal history of adenomatous colon polyps. He is very concerned about his colon cancer risk and wished to schedule colonoscopy at 3 year  interval. If no polyps or only 1 or 2 small precancerous polyps are found we discussed resuming a 5 year interval. The risks (including bleeding, perforation, infection, missed lesions, medication reactions and possible hospitalization or surgery if complications occur), benefits, and alternatives to colonoscopy with possible biopsy and possible polypectomy were discussed with the patient and they consent to proceed.   2. Morbid obesity.

## 2019-10-27 ENCOUNTER — Other Ambulatory Visit (HOSPITAL_COMMUNITY)
Admission: RE | Admit: 2019-10-27 | Discharge: 2019-10-27 | Disposition: A | Discharge status: Home / Self Care | Payer: Commercial Managed Care - PPO | Source: Ambulatory Visit | Attending: Gastroenterology | Admitting: Gastroenterology

## 2019-10-27 DIAGNOSIS — Z20828 Contact with and (suspected) exposure to other viral communicable diseases: Secondary | ICD-10-CM | POA: Diagnosis not present

## 2019-10-27 DIAGNOSIS — Z01812 Encounter for preprocedural laboratory examination: Secondary | ICD-10-CM | POA: Insufficient documentation

## 2019-10-29 LAB — NOVEL CORONAVIRUS, NAA (HOSP ORDER, SEND-OUT TO REF LAB; TAT 18-24 HRS): SARS-CoV-2, NAA: NOT DETECTED

## 2019-10-30 NOTE — Progress Notes (Signed)
Spoke with patient, has been able to quarantine since covid 19 testing without symptoms.  Answered patient questions.  Patient to arrive at 0700 on 10/31/2019

## 2019-10-31 ENCOUNTER — Encounter (HOSPITAL_COMMUNITY): Payer: Self-pay | Admitting: Gastroenterology

## 2019-10-31 ENCOUNTER — Other Ambulatory Visit: Payer: Self-pay

## 2019-10-31 ENCOUNTER — Ambulatory Visit (HOSPITAL_COMMUNITY): Payer: Commercial Managed Care - PPO | Admitting: Certified Registered Nurse Anesthetist

## 2019-10-31 ENCOUNTER — Ambulatory Visit (HOSPITAL_COMMUNITY)
Admission: RE | Admit: 2019-10-31 | Discharge: 2019-10-31 | Disposition: A | Discharge status: Home / Self Care | Payer: Commercial Managed Care - PPO | Attending: Gastroenterology | Admitting: Gastroenterology

## 2019-10-31 ENCOUNTER — Encounter (HOSPITAL_COMMUNITY)
Admission: RE | Disposition: A | Discharge status: Home / Self Care | Payer: Self-pay | Source: Home / Self Care | Attending: Gastroenterology

## 2019-10-31 DIAGNOSIS — Z6841 Body Mass Index (BMI) 40.0 and over, adult: Secondary | ICD-10-CM | POA: Diagnosis not present

## 2019-10-31 DIAGNOSIS — Z1211 Encounter for screening for malignant neoplasm of colon: Secondary | ICD-10-CM | POA: Diagnosis present

## 2019-10-31 DIAGNOSIS — Z860101 Personal history of adenomatous and serrated colon polyps: Secondary | ICD-10-CM

## 2019-10-31 DIAGNOSIS — Z791 Long term (current) use of non-steroidal anti-inflammatories (NSAID): Secondary | ICD-10-CM | POA: Diagnosis not present

## 2019-10-31 DIAGNOSIS — Z86010 Personal history of colonic polyps: Secondary | ICD-10-CM | POA: Diagnosis not present

## 2019-10-31 DIAGNOSIS — Z800 Family history of malignant neoplasm of digestive organs: Secondary | ICD-10-CM | POA: Insufficient documentation

## 2019-10-31 HISTORY — PX: COLONOSCOPY WITH PROPOFOL: SHX5780

## 2019-10-31 SURGERY — COLONOSCOPY WITH PROPOFOL
Anesthesia: Monitor Anesthesia Care

## 2019-10-31 MED ORDER — PROPOFOL 10 MG/ML IV BOLUS
INTRAVENOUS | Status: DC | PRN
  Administered 2019-10-31: 30 mg via INTRAVENOUS
  Administered 2019-10-31: 20 mg via INTRAVENOUS

## 2019-10-31 MED ORDER — PROPOFOL 500 MG/50ML IV EMUL
INTRAVENOUS | Status: AC
  Filled 2019-10-31: qty 50

## 2019-10-31 MED ORDER — SODIUM CHLORIDE 0.9 % IV SOLN: INTRAVENOUS | Status: DC

## 2019-10-31 MED ORDER — ONDANSETRON HCL 4 MG/2ML IJ SOLN
INTRAMUSCULAR | Status: DC | PRN
  Administered 2019-10-31: 4 mg via INTRAVENOUS

## 2019-10-31 MED ORDER — PROPOFOL 500 MG/50ML IV EMUL
INTRAVENOUS | Status: DC | PRN
  Administered 2019-10-31: 150 ug/kg/min via INTRAVENOUS

## 2019-10-31 MED ORDER — LACTATED RINGERS IV SOLN
INTRAVENOUS | Status: DC
  Administered 2019-10-31: 08:00:00 via INTRAVENOUS

## 2019-10-31 MED ORDER — PROPOFOL 10 MG/ML IV BOLUS
INTRAVENOUS | Status: AC
  Filled 2019-10-31: qty 20

## 2019-10-31 SURGICAL SUPPLY — 22 items

## 2019-10-31 NOTE — H&P (Signed)
History of Present Illness: This is a 38 year old male with a family history of colon cancer in his father at about age 63.  He underwent colonoscopy in June 2017 with findings as below.  He has no gastrointestinal complaints.  He is very concerned about his elevated risk of colorectal neoplasms and colon cancer and wishes to proceed with colonoscopy at 3-year interval. Denies weight loss, abdominal pain, constipation, diarrhea, change in stool caliber, melena, hematochezia, nausea, vomiting, dysphagia, reflux symptoms, chest pain.  Colonoscopy 05/2016 - One 6 mm polyp in the sigmoid colon, removed with a cold snare. Resected and Retrieved. Tubular adenoma  - The examination was otherwise normal on direct and retroflexion views.   No Known Allergies       Outpatient Medications Prior to Visit  Medication Sig Dispense Refill  . diclofenac (VOLTAREN) 75 MG EC tablet TAKE 1 TABLET BY MOUTH TWICE A DAY (Patient not taking: Reported on 09/21/2019) 60 tablet 1   No facility-administered medications prior to visit.        Past Medical History:  Diagnosis Date  . Tendon injury    right forearm bicep  . Tubular adenoma of colon 05/2016        Past Surgical History:  Procedure Laterality Date  . COLONOSCOPY WITH PROPOFOL N/A 06/09/2016   Procedure: COLONOSCOPY WITH PROPOFOL;  Surgeon: Ladene Artist, MD;  Location: [facility];  Service: Endoscopy;  Laterality: N/A;  . URETHRAL STRICTURE DILATATION  09/08   repaired x 4. last one 09/08   Social History        Socioeconomic History  . Marital status: Single    Spouse name: Not on file  . Number of children: 3  . Years of education: Not on file  . Highest education level: Not on file  Occupational History  . Occupation: [employer]  Social Needs  . Financial resource strain: Not on file  . Food insecurity    Worry: Not on file    Inability: Not on file  . Transportation needs    Medical: Not on file    Non-medical: Not  on file  Tobacco Use  . Smoking status: Never Smoker  . Smokeless tobacco: Never Used  Substance and Sexual Activity  . Alcohol use: No    Alcohol/week: 0.0 standard drinks    Comment: one beer every 4-5 months  . Drug use: No  . Sexual activity: Not on file  Lifestyle  . Physical activity    Days per week: Not on file    Minutes per session: Not on file  . Stress: Not on file  Relationships  . Social Herbalist on phone: Not on file    Gets together: Not on file    Attends religious service: Not on file    Active member of club or organization: Not on file    Attends meetings of clubs or organizations: Not on file    Relationship status: Not on file  Other Topics Concern  . Not on file  Social History Narrative  . Not on file        Family History  Problem Relation Age of Onset  . Colon cancer Father   . High Cholesterol Father   . Hypertension Father   . Melanoma Sister   . Heart disease Child        x 3    ROS: as in HPI otherwise all systems negative   Physical Exam: General: Well developed, well nourished, no  acute distress Head: Normocephalic and atraumatic Eyes:  sclerae anicteric, EOMI Ears: Normal auditory acuity Mouth: No deformity or lesions Lungs: Clear throughout to auscultation Heart: Regular rate and rhythm; no murmurs, rubs or bruits Abdomen: Soft, non tender and non distended. No masses, hepatosplenomegaly or hernias noted. Normal Bowel sounds Rectal: Deferred to colonoscopy  Musculoskeletal: Symmetrical with no gross deformities  Pulses:  Normal pulses noted Extremities: No clubbing, cyanosis, edema or deformities noted Neurological: Alert oriented x 4, grossly nonfocal Psychological:  Alert and cooperative. Normal mood and affect   Assessment and Recommendations:  1.  Family history of colon cancer in his father at about age 12. Personal history of adenomatous colon polyps. He is very concerned  about his colon cancer risk and wished to schedule colonoscopy at 3 year interval. If no polyps or only 1 or 2 small precancerous polyps are found we discussed resuming a 5 year interval. The risks (including bleeding, perforation, infection, missed lesions, medication reactions and possible hospitalization or surgery if complications occur), benefits, and alternatives to colonoscopy with possible biopsy and possible polypectomy were discussed with the patient and they consent to proceed.   2. Morbid obesity.

## 2019-10-31 NOTE — Transfer of Care (Signed)
Immediate Anesthesia Transfer of Care Note  Patient: Patrick Gallagher  Procedure(s) Performed: COLONOSCOPY WITH PROPOFOL (N/A )  Patient Location: PACU and Endoscopy Unit  Anesthesia Type:MAC  Level of Consciousness: awake, alert  and oriented  Airway & Oxygen Therapy: Patient Spontanous Breathing and Patient connected to face mask oxygen  Post-op Assessment: Report given to RN and Post -op Vital signs reviewed and stable  Post vital signs: Reviewed and stable  Last Vitals:  Vitals Value Taken Time  BP    Temp    Pulse 93 10/31/19 0902  Resp 22 10/31/19 0902  SpO2 100 % 10/31/19 0902  Vitals shown include unvalidated device data.  Last Pain:  Vitals:   10/31/19 0747  TempSrc: Oral  PainSc: 0-No pain         Complications: No apparent anesthesia complications

## 2019-10-31 NOTE — Op Note (Signed)
[facility] Patient Name: Patrick Gallagher Procedure Date: 10/31/2019 MRN: [id number] Attending MD: Ladene Artist , MD Date of Birth: 1981-09-17 CSN: [id number] Age: 38 Admit Type: Inpatient Procedure:                Colonoscopy Indications:              Screening in patient at increased risk: Family                            history of 1st-degree relative with colorectal                            cancer before age 64 years. Personal history of                            adenomatous colon polyps. Providers:                Pricilla Riffle. Fuller Plan, MD, Burtis Junes, RN Referring MD:             Jinny Sanders, MD Medicines:                Monitored Anesthesia Care Complications:            No immediate complications. Estimated blood loss:                            None. Estimated Blood Loss:     Estimated blood loss: none. Procedure:                Pre-Anesthesia Assessment:                           - Prior to the procedure, a History and Physical                            was performed, and patient medications and                            allergies were reviewed. The patient's tolerance of                            previous anesthesia was also reviewed. The risks                            and benefits of the procedure and the sedation                            options and risks were discussed with the patient.                            All questions were answered, and informed consent                            was obtained. Prior Anticoagulants: The patient has  taken no previous anticoagulant or antiplatelet                            agents. ASA Grade Assessment: III - A patient with                            severe systemic disease. After reviewing the risks                            and benefits, the patient was deemed in                            satisfactory condition to undergo the procedure.                           After  obtaining informed consent, the colonoscope                            was passed under direct vision. Throughout the                            procedure, the patient's blood pressure, pulse, and                            oxygen saturations were monitored continuously. The                            [id number]) Olympus colonoscope was                            introduced through the anus and advanced to the the                            cecum, identified by appendiceal orifice and                            ileocecal valve. The ileocecal valve, appendiceal                            orifice, and rectum were photographed. The quality                            of the bowel preparation was good. The colonoscopy                            was performed without difficulty. The patient                            tolerated the procedure well. Scope In: 8:44:15 AM Scope Out: [id number] AM Scope Withdrawal Time: 0 hours 10 minutes 57 seconds  Total Procedure Duration: 0 hours 12 minutes 23 seconds  Findings:      The perianal and digital rectal examinations were normal.      The entire examined colon appeared normal on direct and  retroflexion       views. Impression:               - The entire examined colon is normal on direct and                            retroflexion views.                           - No specimens collected. Moderate Sedation:      Not Applicable - Patient had care per Anesthesia. Recommendation:           - Repeat colonoscopy in 5 years for screening /                            surveillance.                           - Patient has a contact number available for                            emergencies. The signs and symptoms of potential                            delayed complications were discussed with the                            patient. Return to normal activities tomorrow.                            Written discharge instructions were provided to the                             patient.                           - Resume previous diet.                           - Continue present medications. Procedure Code(s):        --- Professional ---                           [id number], Colorectal cancer screening; colonoscopy on                            individual at high risk Diagnosis Code(s):        --- Professional ---                           Z80.0, Family history of malignant neoplasm of                            digestive organs CPT copyright 2019 American Medical Association. All rights reserved. The codes documented in this report are preliminary and upon coder review may  be revised to meet current compliance requirements. Ladene Artist, MD 10/31/2019 9:02:36 AM This report  has been signed electronically. Number of Addenda: 0

## 2019-10-31 NOTE — Anesthesia Preprocedure Evaluation (Signed)
Anesthesia Evaluation  Patient identified by MRN, date of birth, ID band Patient awake    Reviewed: Allergy & Precautions, NPO status , Patient's Chart, lab work & pertinent test results  Airway Mallampati: III  TM Distance: >3 FB Neck ROM: Full    Dental  (+) Dental Advisory Given   Pulmonary neg pulmonary ROS,    breath sounds clear to auscultation       Cardiovascular negative cardio ROS   Rhythm:Regular Rate:Normal     Neuro/Psych negative neurological ROS     GI/Hepatic negative GI ROS, Neg liver ROS,   Endo/Other  Morbid obesity  Renal/GU negative Renal ROS     Musculoskeletal   Abdominal   Peds  Hematology negative hematology ROS (+)   Anesthesia Other Findings   Reproductive/Obstetrics                             Anesthesia Physical Anesthesia Plan  ASA: III  Anesthesia Plan: MAC   Post-op Pain Management:    Induction:   PONV Risk Score and Plan: 1 and Propofol infusion, Ondansetron and Treatment may vary due to age or medical condition  Airway Management Planned: Natural Airway and Simple Face Mask  Additional Equipment:   Intra-op Plan:   Post-operative Plan:   Informed Consent: I have reviewed the patients History and Physical, chart, labs and discussed the procedure including the risks, benefits and alternatives for the proposed anesthesia with the patient or authorized representative who has indicated his/her understanding and acceptance.       Plan Discussed with: CRNA  Anesthesia Plan Comments:         Anesthesia Quick Evaluation

## 2019-10-31 NOTE — Discharge Instructions (Signed)

## 2019-11-01 NOTE — Anesthesia Postprocedure Evaluation (Signed)
Anesthesia Post Note  Patient: Patrick Gallagher  Procedure(s) Performed: COLONOSCOPY WITH PROPOFOL (N/A )     Patient location during evaluation: PACU Anesthesia Type: MAC Level of consciousness: awake and alert Pain management: pain level controlled Vital Signs Assessment: post-procedure vital signs reviewed and stable Respiratory status: spontaneous breathing, nonlabored ventilation, respiratory function stable and patient connected to nasal cannula oxygen Cardiovascular status: stable and blood pressure returned to baseline Postop Assessment: no apparent nausea or vomiting Anesthetic complications: no    Last Vitals:  Vitals:   10/31/19 0910 10/31/19 0920  BP: 127/81 (!) 147/77  Pulse: 85 84  Resp: 15 (!) 21  Temp:  36.6 C  SpO2: 99% 98%    Last Pain:  Vitals:   10/31/19 0920  TempSrc: Oral  PainSc: 0-No pain                 Tiajuana Amass

## 2019-11-02 ENCOUNTER — Encounter (HOSPITAL_COMMUNITY): Payer: Self-pay | Admitting: Gastroenterology

## 2021-05-26 ENCOUNTER — Ambulatory Visit (INDEPENDENT_AMBULATORY_CARE_PROVIDER_SITE_OTHER): Payer: Commercial Managed Care - PPO | Admitting: Podiatry

## 2021-05-26 ENCOUNTER — Ambulatory Visit (INDEPENDENT_AMBULATORY_CARE_PROVIDER_SITE_OTHER): Payer: Commercial Managed Care - PPO

## 2021-05-26 ENCOUNTER — Encounter: Payer: Self-pay | Admitting: Podiatry

## 2021-05-26 ENCOUNTER — Other Ambulatory Visit: Payer: Self-pay

## 2021-05-26 DIAGNOSIS — M7662 Achilles tendinitis, left leg: Secondary | ICD-10-CM

## 2021-05-26 MED ORDER — TRIAMCINOLONE ACETONIDE 10 MG/ML IJ SUSP
10.0000 mg | Freq: Once | INTRAMUSCULAR | Status: AC
  Administered 2021-05-26: 10 mg

## 2021-05-27 NOTE — Progress Notes (Signed)
Subjective:   Patient ID: Patrick Gallagher, male   DOB: 40 y.o.   MRN: [id number]   HPI Patient presents stating that he has had ongoing pain in the back of his left heel and states that he cannot be active anymore admitting he has gained more weight but it is increasingly hard for him to be active due to the pain he is experiencing.  Patient's not been seen for several years   ROS      Objective:  Physical Exam  Patient presents with significant discomfort posterior medial aspect of the left Achilles insertion just medial side no center and lateral and with quite a bit of inflammation fluid buildup     Assessment:  Acute Achilles tendinitis left affecting 1 side     Plan:  H&P reviewed x-rays discussed obesity is a significant complicating factor for him as he weighs 380 pounds 5 foot 9 and whether or not surgery will be indicated.  We will get a try 1 small injection see the results discussed physical therapy which he has already tried in the past discussed treatment modalities but he wants to consider surgery and I would only consider most likely a modified surgery.  I did explain the risk of rupture with injection he is willing to accept risk and I did sterile prep and injected 3 mg Dexasone Kenalog 5 mg Xylocaine keeping it away from the center and lateral portion of the heel.  Patient tolerated well will be seen back  X-rays indicate small spur formation posterior heel no indications of other pathology conveyed to patient

## 2021-06-09 ENCOUNTER — Encounter: Payer: Self-pay | Admitting: Podiatry

## 2021-06-09 ENCOUNTER — Ambulatory Visit (INDEPENDENT_AMBULATORY_CARE_PROVIDER_SITE_OTHER): Payer: Commercial Managed Care - PPO | Admitting: Podiatry

## 2021-06-09 ENCOUNTER — Other Ambulatory Visit: Payer: Self-pay

## 2021-06-09 DIAGNOSIS — M7662 Achilles tendinitis, left leg: Secondary | ICD-10-CM | POA: Diagnosis not present

## 2021-06-09 NOTE — Progress Notes (Signed)
Subjective:   Patient ID: Patrick Gallagher, male   DOB: 40 y.o.   MRN: [id number]   HPI Patient presents stating he has had some improvement but still has a lot of pain in the outside of his left heel he wants to get more active and states that he still is not able to do what he wants to do   ROS      Objective:  Physical Exam  Neurovascular status intact with discomfort on the posterior lateral aspect left heel at the insertion Achilles that is improved but still quite tender when pressed with long-term history of condition     Assessment:  Achilles tendinitis left lateral side still present with moderate equinus     Plan:  H&P reviewed condition at great length and due to extreme obesity I still would like to avoid surgery on him and I went ahead and I discussed shockwave therapy versus PRP injection and we will get a start with shockwave therapy.  All education given to patient air fracture walker dispensed and explained for the postoperative period and also use prior to initiation of shockwave therapy

## 2021-06-23 ENCOUNTER — Other Ambulatory Visit: Payer: Commercial Managed Care - PPO

## 2021-07-04 ENCOUNTER — Ambulatory Visit (INDEPENDENT_AMBULATORY_CARE_PROVIDER_SITE_OTHER): Payer: Commercial Managed Care - PPO | Admitting: *Deleted

## 2021-07-04 ENCOUNTER — Other Ambulatory Visit: Payer: Self-pay

## 2021-07-04 DIAGNOSIS — M7662 Achilles tendinitis, left leg: Secondary | ICD-10-CM

## 2021-07-04 DIAGNOSIS — B351 Tinea unguium: Secondary | ICD-10-CM

## 2021-07-04 NOTE — Patient Instructions (Signed)

## 2021-07-04 NOTE — Progress Notes (Signed)
Patient presents for the 1st EPAT treatment today with complaint of posterior heel pain left. Diagnosed with achilles tendonitis by Dr. Paulla Dolly. This has been ongoing for several months. The patient has tried ice, stretching, NSAIDS and supportive shoe gear with no long term relief.   Most of the pain is located posterior heel over the spur .  ESWT administered and tolerated well.Treatment settings initiated at:   Energy: 15  Ended treatment session today with 3000 shocks at the following settings:   Energy: 15  Frequency: 6.0  Joules: 14.72   Reviewed post EPAT instructions. Advised to avoid ice and NSAIDs throughout the treatment process and to utilize boot or supportive shoes for at least the next 3 days.  Follow up for 2nd treatment in 1 week.

## 2021-07-11 ENCOUNTER — Other Ambulatory Visit: Payer: Self-pay

## 2021-07-11 ENCOUNTER — Ambulatory Visit (INDEPENDENT_AMBULATORY_CARE_PROVIDER_SITE_OTHER): Payer: Commercial Managed Care - PPO | Admitting: *Deleted

## 2021-07-11 DIAGNOSIS — M7662 Achilles tendinitis, left leg: Secondary | ICD-10-CM

## 2021-07-11 NOTE — Progress Notes (Signed)
Patient presents for the 2nd EPAT treatment today with complaint of posterior heel pain left. Diagnosed with achilles tendonitis by Dr. Paulla Dolly. This has been ongoing for several months. The patient has tried ice, stretching, NSAIDS and supportive shoe gear with no long term relief.   Most of the pain today is located posterior heel over the spur. He is still in quite a bit of pain.  ESWT administered and tolerated well.Treatment settings initiated at:   Energy: 20  Ended treatment session today with 3000 shocks at the following settings:   Energy: 20 for 2000 shocks  ~Energy level to 10 for 1000 shocks focusing on just the area of the heel spur. Patient could not tolerate 20 energy level over that area.   Frequency: 5.0  Joules: 16.35   Reviewed post EPAT instructions. Advised to avoid ice and NSAIDs throughout the treatment process and to utilize boot or supportive shoes for at least the next 3 days.  Patient states Dr. Paulla Dolly had talked with him about the PRP injections and thought he had wanted him to have this done in between the EPAT treatment. I will talk to Dr. Paulla Dolly on Monday and see how he wants to proceed. For now, patient will hold off on scheduling EPAT #3 until then. We will contact patient with information about going forward.

## 2021-08-15 ENCOUNTER — Other Ambulatory Visit: Payer: Commercial Managed Care - PPO

## 2021-09-20 ENCOUNTER — Encounter: Payer: Self-pay | Admitting: Gastroenterology

## 2021-12-24 ENCOUNTER — Ambulatory Visit (INDEPENDENT_AMBULATORY_CARE_PROVIDER_SITE_OTHER): Payer: Commercial Managed Care - PPO

## 2021-12-24 ENCOUNTER — Other Ambulatory Visit: Payer: Self-pay

## 2021-12-24 ENCOUNTER — Ambulatory Visit (INDEPENDENT_AMBULATORY_CARE_PROVIDER_SITE_OTHER): Payer: Commercial Managed Care - PPO | Admitting: Podiatry

## 2021-12-24 DIAGNOSIS — M7662 Achilles tendinitis, left leg: Secondary | ICD-10-CM

## 2021-12-24 NOTE — Progress Notes (Signed)
Subjective:   Patient ID: Patrick Gallagher, male   DOB: 41 y.o.   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  Patient is scheduled for MRI of his left heel we will get the results and decide what will be most appropriate to try to help him.  I did discuss that I will probably refer this case to Dr. Sherryle Lis and that he will have to make a decision but given his BMI I do not think this will be done most likely for surgical center but that will be up to Dr. Sherryle Lis as to what he feels is best  X-rays indicate there is roughened type spur formation posterior aspect left heel

## 2022-01-06 ENCOUNTER — Encounter: Payer: Self-pay | Admitting: Podiatry

## 2022-01-19 ENCOUNTER — Ambulatory Visit
Admission: RE | Admit: 2022-01-19 | Discharge: 2022-01-19 | Disposition: A | Discharge status: Home / Self Care | Payer: Commercial Managed Care - PPO | Source: Ambulatory Visit | Attending: Podiatry | Admitting: Podiatry

## 2022-01-19 DIAGNOSIS — M7662 Achilles tendinitis, left leg: Secondary | ICD-10-CM

## 2022-01-19 NOTE — Progress Notes (Signed)
I'm referring patient to you for posterior heel. He is obese but healthy and has not responded to conservative therapy over extensive time. Should have posterior heel with anchor

## 2022-01-22 ENCOUNTER — Other Ambulatory Visit: Payer: Self-pay

## 2022-01-22 ENCOUNTER — Ambulatory Visit (INDEPENDENT_AMBULATORY_CARE_PROVIDER_SITE_OTHER): Payer: Commercial Managed Care - PPO | Admitting: Podiatry

## 2022-01-22 VITALS — Ht 68.0 in | Wt 375.0 lb

## 2022-01-22 DIAGNOSIS — M21862 Other specified acquired deformities of left lower leg: Secondary | ICD-10-CM

## 2022-01-22 DIAGNOSIS — M7662 Achilles tendinitis, left leg: Secondary | ICD-10-CM | POA: Diagnosis not present

## 2022-01-27 ENCOUNTER — Encounter: Payer: Self-pay | Admitting: Podiatry

## 2022-01-27 NOTE — Progress Notes (Signed)
Subjective:  Patient ID: Patrick Gallagher, male    DOB: [birth year],  MRN: [id number]  Chief Complaint  Patient presents with   Tendonitis     Mri follow up per Dr. Sherryle Lis    41 y.o. male presents with the above complaint. History confirmed with patient.  He is referred to me by Dr. Paulla Dolly for consideration of surgical intervention for his severe Achilles tendon issues.  He has exhausted all nonsurgical treatment at this point including injections physical therapy CAM boot and anti-inflammatories.  He works for a [employer].  Says he is ready for surgery at this point.  Objective:  Physical Exam: warm, good capillary refill, no trophic changes or ulcerative lesions, normal DP and PT pulses, and normal sensory exam. Left Foot: tenderness at Achilles tendon insertion and gastrocnemius equinus is noted with a positive silverskiold test  Radiographs: Multiple views x-ray of the left foot: no fracture, dislocation, swelling or degenerative changes noted and has been the deformity with severe degenerative changes within the Achilles tendon and a posterior calcaneal enthesophyte  Study Result  Narrative & Impression  EXAM: MRI OF THE LEFT ANKLE WITHOUT CONTRAST   TECHNIQUE: Multiplanar, multisequence MR imaging of the ankle was performed. No intravenous contrast was administered.   COMPARISON:  Left foot radiographs 05/26/2021 and 12/24/2021   FINDINGS: TENDONS   Peroneal: Mild-to-moderate peroneus longus and brevis tenosynovitis. No tendon tear is seen.   Posteromedial: Intact tibialis posterior, flexor digitorum longus, and flexor hallucis longus tendons.   Anterior: The tibialis anterior, extensor hallucis longus and extensor digitorum longus tendons are intact.   Achilles: There is mild thickening and intermediate T2 signal tendinosis of the distal 4 cm of the Achilles tendon insertion. There is moderate intermediate T2 signal Achilles tendinosis throughout this  region. At the distal most Achilles tendon insertion there are multiple small regions of fluid bright signal within the tendon midsubstance each measuring up to only 2 mm in transverse dimension and up to 5 mm in craniocaudal dimension (sagittal images 10 through 15, axial images 17 and 18). There are moderate marrow edema and cystic changes within the posterosuperior calcaneus at the Achilles tendon insertion.   Plantar Fascia: Intact.   LIGAMENTS   Lateral: The anterior and posterior talofibular, anterior and posterior tibiofibular, and calcaneofibular ligaments are intact.   Medial: The tibiotalar deep deltoid and tibial spring ligaments are intact.   CARTILAGE   Ankle Joint: Intact cartilage.   Subtalar Joints/Sinus Tarsi: Fat is preserved within sinus tarsi.   Bones: Normal marrow signal. No acute fracture.   Other: Mild medial ankle subcutaneous fat edema and swelling. Mild edema within the lateral ankle soft tissues. The tarsal tunnel is unremarkable. The Lisfranc ligament complex is intact.   IMPRESSION:: IMPRESSION: 1. Moderate diffuse distal Achilles tendinosis. There are approximally 4 regions of tiny partial-thickness tears within the midsubstance of the far distal Achilles tendon insertion (axial image 18) as described above. No tendon retraction. Moderate marrow edema and cystic change within the underlying calcaneus, likely acute on chronic stress related traction changes. 2. Mild-to-moderate peroneus longus and brevis tenosynovitis.     Electronically Signed   By: Yvonne Kendall M.D.   On: 01/19/2022 15:32   Assessment:   1. Achilles tendinitis of left lower extremity      Plan:  Patient was evaluated and treated and all questions answered.  Discussed the etiology and treatment options for Achilles tendinitis including stretching, formal physical therapy with an eccentric exercises therapy plan,  supportive shoegears such as a running shoe or  sneaker, heel lifts, topical and oral medications.  We also discussed that I do not routinely perform injections in this area because of the risk of an increased damage or rupture of the tendon.  We also discussed the role of surgical treatment of this for patients who do not improve after exhausting non-surgical treatment options.  I reviewed his MRI with him and I reviewed his treatment thus far.  I think his main reasonable attempt to try every nonsurgical treatment possible.  At this point he is interested in surgical correction.  I discussed with him that his BMI of 57 he is at high risk of complications due to anesthesia.  I do think he will be compliant with his nonweightbearing regimen postoperatively, he says he has easy access to get a knee scooter through his work.  Other than his obesity he has no other risk factors that would preclude him to complications following surgery, I think he has the strength and motivation to proceed with intervention and postoperative rehab.  I discussed the postoperative course needed.  Of nonweightbearing and the amount of rehab required for this.  We discussed all risk benefits and potential complications from surgery including but not limited to pain, swelling, infection, scar, numbness which may be temporary or permanent, chronic pain, stiffness, nerve pain or damage, wound healing problems.  He understands these risks and wishes to proceed.  No guarantees were given as to the outcome of the surgery.  Informed consent was signed and reviewed.  All questions were addressed.   Surgical plan:  Procedure: -Left Achilles reconstruction with debridement, reattachment of the tendon resection of Haglund's deformity and calcaneal spurs and endoscopic gastrocnemius recession  Location: -[facility]  Anesthesia plan: -General anesthesia in prone position with regional block popliteal and saphenous  Postoperative pain plan: - Tylenol 1000 mg  every 6 hours, ibuprofen 600 mg every 6 hours, gabapentin 300 mg every 8 hours x5 days, oxycodone 5 mg 1-2 tabs every 6 hours only as needed  DVT prophylaxis: -Xarelto nightly  WB Restrictions / DME needs: -NWB in cast     No follow-ups on file.

## 2022-02-02 NOTE — Addendum Note (Signed)
Addended bySherryle Lis, Lonzie Simmer R on: 02/02/2022 07:45 AM   Modules accepted: Orders

## 2022-06-17 ENCOUNTER — Ambulatory Visit (INDEPENDENT_AMBULATORY_CARE_PROVIDER_SITE_OTHER): Payer: Commercial Managed Care - PPO | Admitting: Family Medicine

## 2022-06-17 ENCOUNTER — Encounter: Payer: Self-pay | Admitting: Family Medicine

## 2022-06-17 VITALS — BP 134/82 | HR 89 | Temp 98.2°F | Resp 18 | Ht 68.0 in | Wt >= 6400 oz

## 2022-06-17 DIAGNOSIS — M67972 Unspecified disorder of synovium and tendon, left ankle and foot: Secondary | ICD-10-CM | POA: Diagnosis not present

## 2022-06-17 DIAGNOSIS — Z01818 Encounter for other preprocedural examination: Secondary | ICD-10-CM

## 2022-06-17 DIAGNOSIS — Z23 Encounter for immunization: Secondary | ICD-10-CM | POA: Diagnosis not present

## 2022-06-17 DIAGNOSIS — E662 Morbid (severe) obesity with alveolar hypoventilation: Secondary | ICD-10-CM

## 2022-06-17 LAB — CBC WITH DIFFERENTIAL/PLATELET
Basophils Absolute: 0 10*3/uL (ref 0.0–0.1)
Basophils Relative: 0.3 % (ref 0.0–3.0)
Eosinophils Absolute: 0.2 10*3/uL (ref 0.0–0.7)
Eosinophils Relative: 2.1 % (ref 0.0–5.0)
HCT: 42.1 % (ref 39.0–52.0)
Hemoglobin: 14.4 g/dL (ref 13.0–17.0)
Lymphocytes Relative: 19.7 % (ref 12.0–46.0)
Lymphs Abs: 1.7 10*3/uL (ref 0.7–4.0)
MCHC: 34.1 g/dL (ref 30.0–36.0)
MCV: 84.6 fl (ref 78.0–100.0)
Monocytes Absolute: 0.6 10*3/uL (ref 0.1–1.0)
Monocytes Relative: 6.4 % (ref 3.0–12.0)
Neutro Abs: 6.3 10*3/uL (ref 1.4–7.7)
Neutrophils Relative %: 71.5 % (ref 43.0–77.0)
Platelets: 388 10*3/uL (ref 150.0–400.0)
RBC: 4.98 Mil/uL (ref 4.22–5.81)
RDW: 14.5 % (ref 11.5–15.5)
WBC: 8.9 10*3/uL (ref 4.0–10.5)

## 2022-06-17 LAB — COMPREHENSIVE METABOLIC PANEL
ALT: 23 U/L (ref 0–53)
AST: 17 U/L (ref 0–37)
Albumin: 4.4 g/dL (ref 3.5–5.2)
Alkaline Phosphatase: 54 U/L (ref 39–117)
BUN: 18 mg/dL (ref 6–23)
CO2: 25 mEq/L (ref 19–32)
Calcium: 9.7 mg/dL (ref 8.4–10.5)
Chloride: 100 mEq/L (ref 96–112)
Creatinine, Ser: 0.88 mg/dL (ref 0.40–1.50)
GFR: 106.6 mL/min (ref >60.00)
Glucose, Bld: 99 mg/dL (ref 70–99)
Potassium: 4.1 mEq/L (ref 3.5–5.1)
Sodium: 135 mEq/L (ref 135–145)
Total Bilirubin: 0.3 mg/dL (ref 0.2–1.2)
Total Protein: 7.5 g/dL (ref 6.0–8.3)

## 2022-06-17 LAB — HEMOGLOBIN A1C: Hgb A1c MFr Bld: 6 % (ref 4.6–6.5)

## 2022-06-17 NOTE — Patient Instructions (Signed)
Welcome to [facility]! It was a pleasure meeting you today.  As discussed, Please schedule a 3 month follow up visit today.  Check insurance for TJQZES, Saxenda, or other weight loss drugs  PLEASE NOTE:  If you had any LAB tests please let us know if you have not heard back within a few days. You may see your results on MyChart before we have a chance to review them but we will give you a call once they are reviewed by [place]. If we ordered any REFERRALS today, please let us know if you have not heard from their office within the next week.  Let us know through MyChart if you are needing REFILLS, or have your pharmacy send [place] the request. You can also use MyChart to communicate with me or any office staff.  Please try these tips to maintain a healthy lifestyle:  Eat most of your calories during the day when you are active. Eliminate processed foods including packaged sweets (pies, cakes, cookies), reduce intake of potatoes, white bread, white pasta, and white rice. Look for whole grain options, oat flour or almond flour.  Each meal should contain half fruits/vegetables, one quarter protein, and one quarter carbs (no bigger than a computer mouse).  Cut down on sweet beverages. This includes juice, soda, and sweet tea. Also watch fruit intake, though this is a healthier sweet option, it still contains natural sugar! Limit to 3 servings daily.  Drink at least 1 glass of water with each meal and aim for at least 8 glasses per day  Exercise at least 150 minutes every week.

## 2022-06-17 NOTE — H&P (View-Only) (Signed)
New Patient Office Visit  Subjective:  Patient ID: Patrick Gallagher, male    DOB: 01/31/1981  Age: 41 y.o. MRN: [id number]  CC:  Chief Complaint  Patient presents with   Establish Care    Need pcp Need surgical clearance Fasting      HPI Patrick Gallagher presents for new pt.  Surg clearance Achillle's tendon repair L. Spur.  07/10/22-[facility] No cp/sob.  Can go up flight of stairs.  Intercourse ok.  Limited exercise d/t foot. No h/o OSA.  Falls asleep easily, snores. Not fall asleep driving/passenger.  But falls asleep watching TV.  Went to wt loss clinic 10 yrs ago-lost some but gained back.     Past Medical History:  Diagnosis Date   Tendon injury    right forearm bicep   Tubular adenoma of colon 05/2016    Past Surgical History:  Procedure Laterality Date   COLONOSCOPY WITH PROPOFOL N/A 06/09/2016   Procedure: COLONOSCOPY WITH PROPOFOL;  Surgeon: Ladene Artist, MD;  Location: [facility];  Service: Endoscopy;  Laterality: N/A;   COLONOSCOPY WITH PROPOFOL N/A 10/31/2019   Procedure: COLONOSCOPY WITH PROPOFOL;  Surgeon: Ladene Artist, MD;  Location: [facility];  Service: Endoscopy;  Laterality: N/A;   URETHRAL STRICTURE DILATATION  09/08   repaired x 4. last one 09/08    Family History  Problem Relation Age of Onset   Hypertension Mother    Hyperlipidemia Mother    Arthritis Mother    Cancer Father    Hyperlipidemia Father    Colon cancer Father 33       dec 59   High Cholesterol Father    Hypertension Father    Cancer Sister 60       skin-melanoma   Melanoma Sister    Heart disease Child        x 3    Social History   Socioeconomic History   Marital status: Married    Spouse name: Not on file   Number of children: 3   Years of education: Not on file   Highest education level: Not on file  Occupational History   Occupation: [employer]  Tobacco Use   Smoking status: Every Day    Types: Cigars   Smokeless tobacco: Never  Vaping Use    Vaping Use: Never used  Substance and Sexual Activity   Alcohol use: Yes    Comment: one beer every 4-5 months   Drug use: Never   Sexual activity: Yes  Other Topics Concern   Not on file  Social History Narrative   [employer]   Social Determinants of Health   Financial Resource Strain: Not on file  Food Insecurity: Not on file  Transportation Needs: Not on file  Physical Activity: Not on file  Stress: Not on file  Social Connections: Not on file  Intimate Partner Violence: Not on file    ROS  ROS: Gen: no fever, chills  Skin: no rash, itching ENT: no ear pain, ear drainage, nasal congestion, rhinorrhea, sinus pressure, sore throat Eyes: no blurry vision, double vision Resp: no cough, wheeze,SOB CV: no CP, palpitations, LE edema,  GI: no heartburn, n/v/d/c, abd pain GU: no dysuria, urgency, frequency, hematuria MSK: occ back.  L Achille's now Neuro: no dizziness, headache, weakness, vertigo.   Occ ha-more in am Psych: no depression, anxiety, insomnia, SI   Objective:   Today's Vitals: BP 134/82   Pulse 89   Temp 98.2 F (36.8 C) (Temporal)  Resp 18   Ht '5\' 8"'$  (1.727 m)   Wt (!) 440 lb 2 oz (199.6 kg)   SpO2 97%   BMI 66.92 kg/m   Physical Exam  Gen: WDWN NAD MOWM HEENT: NCAT, conjunctiva not injected, sclera nonicteric TM WNL B, OP moist, no exudates  NECK:  supple, no thyromegaly, no nodes, no carotid bruits CARDIAC: RRR, S1S2+, no murmur. DP 2+B LUNGS: CTAB. No wheezes ABDOMEN:  BS+, soft, NTND, No HSM, no masses EXT:  no edema MSK: no gross abnormalities.  NEURO: A&O x3.  CN II-XII intact.  PSYCH: normal mood. Good eye contact   Assessment & Plan:   Problem List Items Addressed This Visit       Musculoskeletal and Integument   Achilles tendon disorder, left   Other Visit Diagnoses     Preop exam for internal medicine    -  Primary   Relevant Orders   CBC with Differential/Platelet   Comprehensive metabolic panel   Hemoglobin A1c   Need  for Tdap vaccination       Relevant Orders   Tdap vaccine greater than or equal to 7yo IM (Completed)   Obesity with alveolar hypoventilation and body mass index (BMI) of 40 or greater (HCC)       Relevant Orders   Comprehensive metabolic panel   Hemoglobin A1c      Pre-op exam for L achilles.  Biggest risk is wt.  [place]pot for surgery.  Check cbc,cmp,A1C Achille's tendonitis L-surg sch.  Form filled out Obesity-work on TLC.  Pt will check on meds.  Check cmp/A1C.  F/u 3 mo as having surgery soon.   No outpatient encounter medications on file as of 06/17/2022.   No facility-administered encounter medications on file as of 06/17/2022.    Follow-up: Return in about 3 months (around 09/17/2022) for wt.   Wellington Hampshire, MD

## 2022-06-17 NOTE — Progress Notes (Signed)
New Patient Office Visit  Subjective:  Patient ID: Patrick Gallagher, male    DOB: 17-Oct-1981  Age: 41 y.o. MRN: [id number]  CC:  Chief Complaint  Patient presents with   Establish Care    Need pcp Need surgical clearance Fasting      HPI Patrick Gallagher presents for new pt.  Surg clearance Achillle's tendon repair [place].  07/10/22-[facility] No cp/sob.  Can go up flight of stairs.  Intercourse ok.  Limited exercise d/t foot. No h/o OSA.  Falls asleep easily, snores. Not fall asleep driving/passenger.  But falls asleep watching TV.  Went to wt loss clinic 10 yrs ago-lost some but gained back.     Past Medical History:  Diagnosis Date   Tendon injury    right forearm bicep   Tubular adenoma of colon 05/2016    Past Surgical History:  Procedure Laterality Date   COLONOSCOPY WITH PROPOFOL N/A 06/09/2016   Procedure: COLONOSCOPY WITH PROPOFOL;  Surgeon: Ladene Artist, MD;  Location: [facility];  Service: Endoscopy;  Laterality: N/A;   COLONOSCOPY WITH PROPOFOL N/A 10/31/2019   Procedure: COLONOSCOPY WITH PROPOFOL;  Surgeon: Ladene Artist, MD;  Location: [facility];  Service: Endoscopy;  Laterality: N/A;   URETHRAL STRICTURE DILATATION  09/08   repaired x 4. last one 09/08    Family History  Problem Relation Age of Onset   Hypertension Mother    Hyperlipidemia Mother    Arthritis Mother    Cancer Father    Hyperlipidemia Father    Colon cancer Father 48       dec 59   High Cholesterol Father    Hypertension Father    Cancer Sister 40       skin-melanoma   Melanoma Sister    Heart disease Child        x 3    Social History   Socioeconomic History   Marital status: Married    Spouse name: Not on file   Number of children: 3   Years of education: Not on file   Highest education level: Not on file  Occupational History   Occupation: [employer]  Tobacco Use   Smoking status: Every Day    Types: Cigars   Smokeless tobacco: Never  Vaping Use    Vaping Use: Never used  Substance and Sexual Activity   Alcohol use: Yes    Comment: one beer every 4-5 months   Drug use: Never   Sexual activity: Yes  Other Topics Concern   Not on file  Social History Narrative   [employer]   Social Determinants of Health   Financial Resource Strain: Not on file  Food Insecurity: Not on file  Transportation Needs: Not on file  Physical Activity: Not on file  Stress: Not on file  Social Connections: Not on file  Intimate Partner Violence: Not on file    ROS  ROS: Gen: no fever, chills  Skin: no rash, itching ENT: no ear pain, ear drainage, nasal congestion, rhinorrhea, sinus pressure, sore throat Eyes: no blurry vision, double vision Resp: no cough, wheeze,SOB CV: no CP, palpitations, LE edema,  GI: no heartburn, n/v/d/c, abd pain GU: no dysuria, urgency, frequency, hematuria MSK: occ back.  L Achille's now Neuro: no dizziness, headache, weakness, vertigo.   Occ ha-more in am Psych: no depression, anxiety, insomnia, SI   Objective:   Today's Vitals: BP 134/82   Pulse 89   Temp 98.2 F (36.8 C) (Temporal)  Resp 18   Ht '5\' 8"'$  (1.727 m)   Wt (!) 440 lb 2 oz (199.6 kg)   SpO2 97%   BMI 66.92 kg/m   Physical Exam  Gen: WDWN NAD MOWM HEENT: NCAT, conjunctiva not injected, sclera nonicteric TM WNL B, OP moist, no exudates  NECK:  supple, no thyromegaly, no nodes, no carotid bruits CARDIAC: RRR, S1S2+, no murmur. DP 2+B LUNGS: CTAB. No wheezes ABDOMEN:  BS+, soft, NTND, No HSM, no masses EXT:  no edema MSK: no gross abnormalities.  NEURO: A&O x3.  CN II-XII intact.  PSYCH: normal mood. Good eye contact   Assessment & Plan:   Problem List Items Addressed This Visit       Musculoskeletal and Integument   Achilles tendon disorder, left   Other Visit Diagnoses     Preop exam for internal medicine    -  Primary   Relevant Orders   CBC with Differential/Platelet   Comprehensive metabolic panel   Hemoglobin A1c   Need  for Tdap vaccination       Relevant Orders   Tdap vaccine greater than or equal to 7yo IM (Completed)   Obesity with alveolar hypoventilation and body mass index (BMI) of 40 or greater (HCC)       Relevant Orders   Comprehensive metabolic panel   Hemoglobin A1c      Pre-op exam for L achilles.  Biggest risk is wt.  [place] for surgery.  Check cbc,cmp,A1C Achille's tendonitis L-surg sch.  Form filled out Obesity-work on TLC.  Pt will check on meds.  Check cmp/A1C.  F/u 3 mo as having surgery soon.   No outpatient encounter medications on file as of 06/17/2022.   No facility-administered encounter medications on file as of 06/17/2022.    Follow-up: Return in about 3 months (around 09/17/2022) for wt.   Wellington Hampshire, MD

## 2022-07-01 ENCOUNTER — Encounter
Admission: RE | Admit: 2022-07-01 | Discharge: 2022-07-01 | Disposition: A | Discharge status: Home / Self Care | Payer: Commercial Managed Care - PPO | Source: Ambulatory Visit | Attending: Podiatry | Admitting: Podiatry

## 2022-07-01 HISTORY — DX: Morbid (severe) obesity due to excess calories: E66.01

## 2022-07-01 HISTORY — DX: Unspecified disorder of synovium and tendon, unspecified ankle and foot: M67.979

## 2022-07-01 NOTE — Patient Instructions (Signed)
Your procedure is scheduled on:07-10-22 Friday Report to the Registration Desk on the 1st floor of the [facility].Then proceed to the 2nd floor Surgery Desk To find out your arrival time, please call [phone] between 1PM - 3PM on:07-09-22 Thursday If your arrival time is 6:00 am, do not arrive prior to that time as the [facility] entrance doors do not open until 6:00 am.  REMEMBER: Instructions that are not followed completely may result in serious medical risk, up to and including death; or upon the discretion of your surgeon and anesthesiologist your surgery may need to be rescheduled.  Do not eat food OR drink any liquids after midnight the night before surgery.  No gum chewing, lozengers or hard candies.  Do NOT take any medication the day of surgery  One week prior to surgery: Stop Anti-inflammatories (NSAIDS) such as Advil, Aleve, Ibuprofen, Motrin, Naproxen, Naprosyn and Aspirin based products such as Excedrin, Goodys Powder, BC Powder.You may however, take Tylenol if needed for pain up until the day of surgery.  Stop ANY OVER THE COUNTER supplements/vitamins NOW (07-01-22) until after surgery.  No Alcohol for 24 hours before or after surgery.  No Smoking including e-cigarettes for 24 hours prior to surgery.  No chewable tobacco products for at least 6 hours prior to surgery.  No nicotine patches on the day of surgery.  Do not use any "recreational" drugs for at least a week prior to your surgery.  Please be advised that the combination of cocaine and anesthesia may have negative outcomes, up to and including death. If you test positive for cocaine, your surgery will be cancelled.  On the morning of surgery brush your teeth with toothpaste and water, you may rinse your mouth with mouthwash if you wish. Do not swallow any toothpaste or mouthwash.  Do not wear jewelry, make-up, hairpins, clips or nail polish.  Do not wear lotions, powders, or perfumes.   Do not shave  body from the neck down 48 hours prior to surgery just in case you cut yourself which could leave a site for infection.  Also, freshly shaved skin may become irritated if using the CHG soap.  Contact lenses, hearing aids and dentures may not be worn into surgery.  Do not bring valuables to the hospital. [facility] is not responsible for any missing/lost belongings or valuables.   Notify your doctor if there is any change in your medical condition (cold, fever, infection).  Wear comfortable clothing (specific to your surgery type) to the hospital.  After surgery, you can help prevent lung complications by doing breathing exercises.  Take deep breaths and cough every 1-2 hours. Your doctor may order a device called an Incentive Spirometer to help you take deep breaths. When coughing or sneezing, hold a pillow firmly against your incision with both hands. This is called "splinting." Doing this helps protect your incision. It also decreases belly discomfort.  If you are being admitted to the hospital overnight, leave your suitcase in the car. After surgery it may be brought to your room.  If you are being discharged the day of surgery, you will not be allowed to drive home. You will need a responsible adult (18 years or older) to drive you home and stay with you that night.   If you are taking public transportation, you will need to have a responsible adult (18 years or older) with you. Please confirm with your physician that it is acceptable to use public transportation.   Please  call the [facility] Dept. at [phone] if you have any questions about these instructions.  Surgery Visitation Policy:  Patients undergoing a surgery or procedure may have two family members or support persons with them as long as the person is not COVID-19 positive or experiencing its symptoms.

## 2022-07-07 ENCOUNTER — Telehealth: Payer: Self-pay | Admitting: Urology

## 2022-07-07 NOTE — Telephone Encounter (Signed)
DOS - 07/10/22  CALCANEAL OSTECTOMY LEFT --- [id number] GASTROCNEMIUS RECESS LEFT --- [id number]  [facility] EFFECTIVE DATE - 12/14/2018  PLAN DEDUCTIBLE - $4,000.00 W/ $2,822.74 REMAINING  OUT OF POCKET - $7,000.00 W/ $5,822.74 REMAINING COINSURANCE - 20% COPAY - $0.00  SPOKE WITH PETRA S. WITH [facility] AND SHE STATED THAT FOR CPT CODES 93552, [phone] AND [phone] HAVE BEEN APPROVED, AUTH # [id number], GOOD FROM 07/10/22 - 10/10/22.  REF # PETRA S. 06/30/22 AT 2:18 PM CT

## 2022-07-10 ENCOUNTER — Ambulatory Visit
Admission: RE | Admit: 2022-07-10 | Discharge: 2022-07-10 | Disposition: A | Discharge status: Home / Self Care | Payer: Commercial Managed Care - PPO | Attending: Podiatry | Admitting: Podiatry

## 2022-07-10 ENCOUNTER — Telehealth: Payer: Self-pay | Admitting: *Deleted

## 2022-07-10 ENCOUNTER — Encounter: Payer: Self-pay | Admitting: Podiatry

## 2022-07-10 ENCOUNTER — Ambulatory Visit: Payer: Commercial Managed Care - PPO | Admitting: General Practice

## 2022-07-10 ENCOUNTER — Other Ambulatory Visit: Payer: Self-pay

## 2022-07-10 ENCOUNTER — Ambulatory Visit: Payer: Commercial Managed Care - PPO

## 2022-07-10 ENCOUNTER — Encounter
Admission: RE | Disposition: A | Discharge status: Home / Self Care | Payer: Self-pay | Source: Home / Self Care | Attending: Podiatry

## 2022-07-10 DIAGNOSIS — M62462 Contracture of muscle, left lower leg: Secondary | ICD-10-CM

## 2022-07-10 DIAGNOSIS — Z6841 Body Mass Index (BMI) 40.0 and over, adult: Secondary | ICD-10-CM | POA: Insufficient documentation

## 2022-07-10 DIAGNOSIS — M9262 Juvenile osteochondrosis of tarsus, left ankle: Secondary | ICD-10-CM

## 2022-07-10 DIAGNOSIS — M6788 Other specified disorders of synovium and tendon, other site: Secondary | ICD-10-CM | POA: Diagnosis not present

## 2022-07-10 DIAGNOSIS — M898X7 Other specified disorders of bone, ankle and foot: Secondary | ICD-10-CM | POA: Insufficient documentation

## 2022-07-10 DIAGNOSIS — M7662 Achilles tendinitis, left leg: Secondary | ICD-10-CM | POA: Diagnosis not present

## 2022-07-10 DIAGNOSIS — M21862 Other specified acquired deformities of left lower leg: Secondary | ICD-10-CM | POA: Diagnosis not present

## 2022-07-10 HISTORY — PX: ACHILLES TENDON SURGERY: SHX542

## 2022-07-10 HISTORY — PX: CALCANEAL OSTEOTOMY: SHX1281

## 2022-07-10 HISTORY — PX: GASTROC RECESSION EXTREMITY: SHX6262

## 2022-07-10 SURGERY — REPAIR, TENDON, ACHILLES
Anesthesia: General | Laterality: Left

## 2022-07-10 MED ORDER — ORAL CARE MOUTH RINSE: 15.0000 mL | Freq: Once | OROMUCOSAL | Status: AC

## 2022-07-10 MED ORDER — KETOROLAC TROMETHAMINE 30 MG/ML IJ SOLN
30.0000 mg | INTRAMUSCULAR | Status: AC
  Administered 2022-07-10: 30 mg via INTRAVENOUS

## 2022-07-10 MED ORDER — FENTANYL CITRATE (PF) 100 MCG/2ML IJ SOLN
INTRAMUSCULAR | Status: AC
  Filled 2022-07-10: qty 2

## 2022-07-10 MED ORDER — CHLORHEXIDINE GLUCONATE 0.12 % MT SOLN
OROMUCOSAL | Status: AC
  Filled 2022-07-10: qty 15

## 2022-07-10 MED ORDER — GABAPENTIN 300 MG PO CAPS: 300.0000 mg | ORAL_CAPSULE | Freq: Three times a day (TID) | ORAL | 0 refills | Status: DC

## 2022-07-10 MED ORDER — OXYCODONE HCL 5 MG PO TABS
5.0000 mg | ORAL_TABLET | Freq: Once | ORAL | Status: AC | PRN
  Administered 2022-07-10: 5 mg via ORAL

## 2022-07-10 MED ORDER — LIDOCAINE HCL (PF) 1 % IJ SOLN
INTRAMUSCULAR | Status: DC | PRN
  Administered 2022-07-10 (×2): 1 mL

## 2022-07-10 MED ORDER — BUPIVACAINE HCL (PF) 0.5 % IJ SOLN
INTRAMUSCULAR | Status: DC | PRN
  Administered 2022-07-10 (×2): 20 mL via PERINEURAL

## 2022-07-10 MED ORDER — BUPIVACAINE HCL (PF) 0.5 % IJ SOLN
INTRAMUSCULAR | Status: AC
  Filled 2022-07-10: qty 30

## 2022-07-10 MED ORDER — LIDOCAINE HCL (PF) 1 % IJ SOLN
INTRAMUSCULAR | Status: AC
  Filled 2022-07-10: qty 5

## 2022-07-10 MED ORDER — ACETAMINOPHEN 500 MG PO TABS: 1000.0000 mg | ORAL_TABLET | Freq: Four times a day (QID) | ORAL | 0 refills | Status: AC | PRN

## 2022-07-10 MED ORDER — SUCCINYLCHOLINE CHLORIDE 200 MG/10ML IV SOSY
PREFILLED_SYRINGE | INTRAVENOUS | Status: DC | PRN
  Administered 2022-07-10: 100 mg via INTRAVENOUS

## 2022-07-10 MED ORDER — PHENYLEPHRINE HCL-NACL 20-0.9 MG/250ML-% IV SOLN
INTRAVENOUS | Status: AC
  Filled 2022-07-10: qty 250

## 2022-07-10 MED ORDER — CEFAZOLIN SODIUM-DEXTROSE 2-4 GM/100ML-% IV SOLN
INTRAVENOUS | Status: AC
  Filled 2022-07-10: qty 100

## 2022-07-10 MED ORDER — FAMOTIDINE 20 MG PO TABS
ORAL_TABLET | ORAL | Status: AC
  Filled 2022-07-10: qty 1

## 2022-07-10 MED ORDER — DEXAMETHASONE SODIUM PHOSPHATE 10 MG/ML IJ SOLN
INTRAMUSCULAR | Status: DC | PRN
  Administered 2022-07-10: 10 mg via INTRAVENOUS

## 2022-07-10 MED ORDER — CEFAZOLIN SODIUM-DEXTROSE 1-4 GM/50ML-% IV SOLN: INTRAVENOUS | Status: DC | PRN

## 2022-07-10 MED ORDER — PROPOFOL 10 MG/ML IV BOLUS
INTRAVENOUS | Status: AC
Start: 2022-07-10 — End: ?
  Filled 2022-07-10: qty 20

## 2022-07-10 MED ORDER — GLYCOPYRROLATE 0.2 MG/ML IJ SOLN
INTRAMUSCULAR | Status: DC | PRN
  Administered 2022-07-10: .2 mg via INTRAVENOUS

## 2022-07-10 MED ORDER — ACETAMINOPHEN 10 MG/ML IV SOLN
INTRAVENOUS | Status: AC
  Filled 2022-07-10: qty 100

## 2022-07-10 MED ORDER — FENTANYL CITRATE (PF) 100 MCG/2ML IJ SOLN
25.0000 ug | INTRAMUSCULAR | Status: DC | PRN
  Administered 2022-07-10 (×2): 50 ug via INTRAVENOUS
  Administered 2022-07-10: 25 ug via INTRAVENOUS

## 2022-07-10 MED ORDER — DROPERIDOL 2.5 MG/ML IJ SOLN: 0.6250 mg | Freq: Once | INTRAMUSCULAR | Status: DC | PRN

## 2022-07-10 MED ORDER — ONDANSETRON HCL 4 MG/2ML IJ SOLN
INTRAMUSCULAR | Status: DC | PRN
  Administered 2022-07-10 (×2): 4 mg via INTRAVENOUS

## 2022-07-10 MED ORDER — LACTATED RINGERS IV SOLN: INTRAVENOUS | Status: DC

## 2022-07-10 MED ORDER — PROPOFOL 1000 MG/100ML IV EMUL
INTRAVENOUS | Status: AC
  Filled 2022-07-10: qty 100

## 2022-07-10 MED ORDER — SEVOFLURANE IN SOLN
RESPIRATORY_TRACT | Status: AC
  Filled 2022-07-10: qty 250

## 2022-07-10 MED ORDER — OXYCODONE HCL 5 MG/5ML PO SOLN: 5.0000 mg | Freq: Once | ORAL | Status: AC | PRN

## 2022-07-10 MED ORDER — OXYCODONE HCL 5 MG PO TABS: 5.0000 mg | ORAL_TABLET | ORAL | 0 refills | Status: DC | PRN

## 2022-07-10 MED ORDER — FENTANYL CITRATE (PF) 100 MCG/2ML IJ SOLN
INTRAMUSCULAR | Status: AC
  Administered 2022-07-10: 25 ug via INTRAVENOUS
  Filled 2022-07-10: qty 2

## 2022-07-10 MED ORDER — PROPOFOL 10 MG/ML IV BOLUS
INTRAVENOUS | Status: DC | PRN
  Administered 2022-07-10: 250 mg via INTRAVENOUS

## 2022-07-10 MED ORDER — CEFAZOLIN SODIUM-DEXTROSE 2-4 GM/100ML-% IV SOLN
2.0000 g | INTRAVENOUS | Status: AC
  Administered 2022-07-10: 3 g via INTRAVENOUS

## 2022-07-10 MED ORDER — BUPIVACAINE HCL (PF) 0.5 % IJ SOLN
INTRAMUSCULAR | Status: AC
  Filled 2022-07-10: qty 40

## 2022-07-10 MED ORDER — FENTANYL CITRATE (PF) 100 MCG/2ML IJ SOLN
INTRAMUSCULAR | Status: DC | PRN
  Administered 2022-07-10: 50 ug via INTRAVENOUS
  Administered 2022-07-10: 100 ug via INTRAVENOUS
  Administered 2022-07-10: 50 ug via INTRAVENOUS

## 2022-07-10 MED ORDER — 0.9 % SODIUM CHLORIDE (POUR BTL) OPTIME
TOPICAL | Status: DC | PRN
  Administered 2022-07-10: 500 mL

## 2022-07-10 MED ORDER — KETOROLAC TROMETHAMINE 30 MG/ML IJ SOLN
INTRAMUSCULAR | Status: AC
  Filled 2022-07-10: qty 1

## 2022-07-10 MED ORDER — ACETAMINOPHEN 10 MG/ML IV SOLN
INTRAVENOUS | Status: DC | PRN
  Administered 2022-07-10: 1000 mg via INTRAVENOUS

## 2022-07-10 MED ORDER — OXYCODONE HCL 5 MG PO TABS
ORAL_TABLET | ORAL | Status: AC
  Filled 2022-07-10: qty 1

## 2022-07-10 MED ORDER — RIVAROXABAN 10 MG PO TABS: 10.0000 mg | ORAL_TABLET | Freq: Every day | ORAL | 0 refills | Status: DC

## 2022-07-10 MED ORDER — SUGAMMADEX SODIUM 500 MG/5ML IV SOLN
INTRAVENOUS | Status: DC | PRN
  Administered 2022-07-10: 600 mg via INTRAVENOUS

## 2022-07-10 MED ORDER — MIDAZOLAM HCL 2 MG/2ML IJ SOLN
INTRAMUSCULAR | Status: DC | PRN
  Administered 2022-07-10: 2 mg via INTRAVENOUS

## 2022-07-10 MED ORDER — ROCURONIUM BROMIDE 100 MG/10ML IV SOLN
INTRAVENOUS | Status: DC | PRN
  Administered 2022-07-10: 50 mg via INTRAVENOUS
  Administered 2022-07-10: 20 mg via INTRAVENOUS
  Administered 2022-07-10: 30 mg via INTRAVENOUS

## 2022-07-10 MED ORDER — CHLORHEXIDINE GLUCONATE 0.12 % MT SOLN
15.0000 mL | Freq: Once | OROMUCOSAL | Status: AC
  Administered 2022-07-10: 15 mL via OROMUCOSAL

## 2022-07-10 MED ORDER — HYDROMORPHONE HCL 1 MG/ML IJ SOLN
INTRAMUSCULAR | Status: AC
  Filled 2022-07-10: qty 1

## 2022-07-10 MED ORDER — CEFAZOLIN SODIUM-DEXTROSE 1-4 GM/50ML-% IV SOLN
INTRAVENOUS | Status: AC
  Filled 2022-07-10: qty 50

## 2022-07-10 MED ORDER — LIDOCAINE HCL (CARDIAC) PF 100 MG/5ML IV SOSY
PREFILLED_SYRINGE | INTRAVENOUS | Status: DC | PRN
  Administered 2022-07-10: 100 mg via INTRAVENOUS

## 2022-07-10 MED ORDER — FAMOTIDINE 20 MG PO TABS
20.0000 mg | ORAL_TABLET | Freq: Once | ORAL | Status: AC
  Administered 2022-07-10: 20 mg via ORAL

## 2022-07-10 MED ORDER — APIXABAN 2.5 MG PO TABS
2.5000 mg | ORAL_TABLET | Freq: Two times a day (BID) | ORAL | 0 refills | Status: DC
Start: 2022-07-10 — End: 2022-07-16

## 2022-07-10 MED ORDER — MIDAZOLAM HCL 2 MG/2ML IJ SOLN
INTRAMUSCULAR | Status: AC
  Filled 2022-07-10: qty 2

## 2022-07-10 MED ORDER — ACETAMINOPHEN 10 MG/ML IV SOLN: 1000.0000 mg | Freq: Once | INTRAVENOUS | Status: DC | PRN

## 2022-07-10 SURGICAL SUPPLY — 47 items
BIT DRILL ANCHOR 2.5X (BIT) ×1 IMPLANT
BLADE OSCILLATING/SAGITTAL (BLADE) ×2
BLADE SW THK.38XMED LNG THN (BLADE) IMPLANT
BNDG CMPR 5X6 CHSV STRCH STRL (GAUZE/BANDAGES/DRESSINGS) ×1
BNDG COHESIVE 6X5 TAN ST LF (GAUZE/BANDAGES/DRESSINGS) ×3 IMPLANT
BNDG ELASTIC 4X5.8 VLCR STR LF (GAUZE/BANDAGES/DRESSINGS) ×3 IMPLANT
BNDG ELASTIC 6X5.8 VLCR STR LF (GAUZE/BANDAGES/DRESSINGS) ×3 IMPLANT
BNDG ESMARK 4X12 TAN STRL LF (GAUZE/BANDAGES/DRESSINGS) ×3 IMPLANT
BNDG GAUZE DERMACEA FLUFF (GAUZE/BANDAGES/DRESSINGS) ×1
BNDG GAUZE DERMACEA FLUFF 4 (GAUZE/BANDAGES/DRESSINGS) ×2 IMPLANT
BNDG GZE DERMACEA 4 6PLY (GAUZE/BANDAGES/DRESSINGS) ×1
CAST PADDING 6X4YD ST 30248 (SOFTGOODS) ×1
COVER LIGHT HANDLE STERIS (MISCELLANEOUS) ×3 IMPLANT
DRAPE FLUOR MINI C-ARM 54X84 (DRAPES) ×3 IMPLANT
DRAPE U-SHAPE 47X51 STRL (DRAPES) ×3 IMPLANT
DRSG PAD ABDOMINAL 8X10 ST (GAUZE/BANDAGES/DRESSINGS) ×3 IMPLANT
DURAPREP 26ML APPLICATOR (WOUND CARE) ×3 IMPLANT
GAUZE SPONGE 4X4 12PLY STRL (GAUZE/BANDAGES/DRESSINGS) ×1 IMPLANT
GAUZE XEROFORM 1X8 LF (GAUZE/BANDAGES/DRESSINGS) ×3 IMPLANT
GLOVE SURG ORTHO LTX SZ7 (GLOVE) ×3 IMPLANT
HANDLE YANKAUER SUCT BULB TIP (MISCELLANEOUS) ×1 IMPLANT
KIT ANCHR SUT SONIC FF2 2.5X10 (Anchor) ×5 IMPLANT
KIT GASTROC RECESSION INST (INSTRUMENTS) ×1 IMPLANT
NEEDLE HYPO 22GX1.5 SAFETY (NEEDLE) ×3 IMPLANT
NS IRRIG 1000ML POUR BTL (IV SOLUTION) ×3 IMPLANT
PACK BASIN MAJOR ARMC (MISCELLANEOUS) ×3 IMPLANT
PACK EXTREMITY ARMC (MISCELLANEOUS) ×3 IMPLANT
PAD ABD DERMACEA PRESS 5X9 (GAUZE/BANDAGES/DRESSINGS) ×1 IMPLANT
PAD CAST CTTN 4X4 STRL (SOFTGOODS) IMPLANT
PADDING CAST COTTON 4X4 STRL (SOFTGOODS) ×2
PADDING CAST COTTON 6X4 ST (SOFTGOODS) IMPLANT
PENCIL SMOKE EVACUATOR (MISCELLANEOUS) ×3 IMPLANT
RASP SM TEAR CROSS CUT (RASP) ×3 IMPLANT
SPIKE FLUID TRANSFER (MISCELLANEOUS) ×3 IMPLANT
SPONGE T-LAP 4X18 ~~LOC~~+RFID (SPONGE) ×3 IMPLANT
STAPLER VISISTAT 35W (STAPLE) ×3 IMPLANT
STRIP CLOSURE SKIN 1/2X4 (GAUZE/BANDAGES/DRESSINGS) ×3 IMPLANT
SUCTION FRAZIER HANDLE 10FR (MISCELLANEOUS) ×2
SUCTION TUBE FRAZIER 10FR DISP (MISCELLANEOUS) ×2 IMPLANT
SUT ETHILON 3-0 (SUTURE) ×3 IMPLANT
SUT ETHILON NAB PS2 4-0 18IN (SUTURE) ×3 IMPLANT
SUT MON AB 3-0 SH 27 (SUTURE) ×3 IMPLANT
SUT VIC AB 3-0 PS2 18 (SUTURE) ×3 IMPLANT
SUT VIC AB 4-0 PS2 27 (SUTURE) ×3 IMPLANT
SYR CONTROL 10ML LL (SYRINGE) ×3 IMPLANT
Sonic Anchro System Drill IMPLANT
TOWEL OR 17X26 4PK STRL BLUE (TOWEL DISPOSABLE) ×3 IMPLANT

## 2022-07-10 NOTE — Anesthesia Procedure Notes (Signed)
Procedure Name: Intubation Date/Time: 07/10/2022 7:14 AM  Performed by: Kelton Pillar, CRNAPre-anesthesia Checklist: Patient identified, Emergency Drugs available, Suction available and Patient being monitored Patient Re-evaluated:Patient Re-evaluated prior to induction Oxygen Delivery Method: Circle system utilized Preoxygenation: Pre-oxygenation with 100% oxygen Induction Type: IV induction Ventilation: Mask ventilation without difficulty Laryngoscope Size: McGraph and 4 Grade View: Grade I Tube type: Oral Tube size: 8.0 mm Number of attempts: 1 Airway Equipment and Method: Stylet and Oral airway Placement Confirmation: ETT inserted through vocal cords under direct vision, positive ETCO2, breath sounds checked- equal and bilateral and CO2 detector Secured at: 21 cm Tube secured with: Tape Dental Injury: Teeth and Oropharynx as per pre-operative assessment

## 2022-07-10 NOTE — Transfer of Care (Signed)
Immediate Anesthesia Transfer of Care Note  Patient: Patrick Gallagher  Procedure(s) Performed: ACHILLES TENDON REPAIR (Left) GASTROC RECESSION EXTREMITY (Left) CALCANEAL OSTEOTOMY (Left)  Patient Location: PACU  Anesthesia Type:General  Level of Consciousness: awake, drowsy and patient cooperative  Airway & Oxygen Therapy: Patient Spontanous Breathing and Patient connected to face mask oxygen  Post-op Assessment: Report given to RN and Post -op Vital signs reviewed and stable  Post vital signs: Reviewed and stable  Last Vitals:  Vitals Value Taken Time  BP 129/67 07/10/22 0948  Temp 37.1 C 07/10/22 0948  Pulse 92 07/10/22 0954  Resp 15 07/10/22 0954  SpO2 100 % 07/10/22 0954  Vitals shown include unvalidated device data.  Last Pain:  Vitals:   07/10/22 0948  PainSc: 8          Complications: No notable events documented.

## 2022-07-10 NOTE — Brief Op Note (Signed)
07/10/2022  9:51 AM  PATIENT:  Patrick Gallagher  41 y.o. 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  PROCEDURE:  Procedure(s): ACHILLES TENDON REPAIR (Left) GASTROC RECESSION EXTREMITY (Left) CALCANEAL OSTECTOMY (Left)  SURGEON:  Surgeon(s) and Role:    * Imanuel Pruiett, Stephan Minister, DPM - Primary    ASSISTANTS: none   ANESTHESIA:   General and regional  EBL: 30 cc  BLOOD ADMINISTERED:none  DRAINS: none   LOCAL MEDICATIONS USED:  NONE  SPECIMEN:  No Specimen  DISPOSITION OF SPECIMEN:  N/A  COUNTS:  YES  TOURNIQUET:   Total Tourniquet Time Documented: Calf (Left) - 17 minutes Total: Calf (Left) - 17 minutes  Esmarch bandage around calf/ankle for 60 minutes  DICTATION: .Note written in EPIC  PLAN OF CARE: Discharge to home after PACU  PATIENT DISPOSITION:  PACU - hemodynamically stable.   Delay start of Pharmacological VTE agent (>24hrs) due to surgical blood loss or risk of bleeding: no, can start Xarelto tonight  NWB LLE on knee scooter

## 2022-07-10 NOTE — Telephone Encounter (Signed)
Rx changed to xarelto, advised if not covered and Prior Auth can't be done we will do Lovenox

## 2022-07-10 NOTE — Interval H&P Note (Signed)
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  The various methods of treatment have been discussed with the patient and family. After consideration of risks, benefits and other options for treatment, the patient has consented to  Procedure(s): ACHILLES TENDON REPAIR (Left) GASTROC RECESSION EXTREMITY (Left) CALCANEAL OSTEOTOMY (Left) as a surgical intervention.  The patient's history has been reviewed, patient examined, no change in status, stable for surgery.  I have reviewed the patient's chart and labs.  Questions were answered to the patient's satisfaction.     [name]

## 2022-07-10 NOTE — Anesthesia Preprocedure Evaluation (Addendum)
Anesthesia Evaluation  Patient identified by MRN, date of birth, ID band Patient awake    Reviewed: Allergy & Precautions, NPO status , Patient's Chart, lab work & pertinent test results  Airway Mallampati: III  TM Distance: >3 FB Neck ROM: full    Dental no notable dental hx.    Pulmonary shortness of breath and with exertion,    Pulmonary exam normal        Cardiovascular Exercise Tolerance: Poor (-) angina+ DOE  Normal cardiovascular exam     Neuro/Psych negative neurological ROS  negative psych ROS   GI/Hepatic negative GI ROS, Neg liver ROS,   Endo/Other  Morbid obesity  Renal/GU      Musculoskeletal   Abdominal (+) + obese,   Peds  Hematology negative hematology ROS (+)   Anesthesia Other Findings Past Medical History: No date: Achilles tendon disorder No date: Morbid obesity ([place]) No date: Tendon injury     Comment:  right forearm bicep 05/2016: Tubular adenoma of colon  Past Surgical History: 06/09/2016: COLONOSCOPY WITH PROPOFOL; N/A     Comment:  Procedure: COLONOSCOPY WITH PROPOFOL;  Surgeon: Ladene Artist, MD;  [facility]: [facility];  Service:               Endoscopy;  Laterality: N/A; 10/31/2019: COLONOSCOPY WITH PROPOFOL; N/A     Comment:  Procedure: COLONOSCOPY WITH PROPOFOL;  Surgeon: Ladene Artist, MD;  [facility]: [facility];  Service:               Endoscopy;  Laterality: N/A; No date: FOOT SURGERY     Comment:  as a child 08/15/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 last one 09/08 No date: URETHROPLASTY  BMI    Body Mass Index: 64.98 kg/m      Reproductive/Obstetrics negative OB ROS                           Anesthesia Physical Anesthesia Plan  ASA: 3  Anesthesia Plan: General ETT   Post-op Pain Management: Regional block*, Toradol IV (intra-op)* and Ofirmev IV (intra-op)*   Induction:  Rapid sequence and Intravenous  PONV Risk Score and Plan: 2 and Ondansetron and Dexamethasone  Airway Management Planned: Oral ETT  Additional Equipment:   Intra-op Plan:   Post-operative Plan: Extubation in OR  Informed Consent: I have reviewed the patients History and Physical, chart, labs and discussed the procedure including the risks, benefits and alternatives for the proposed anesthesia with the patient or authorized representative who has indicated his/her understanding and acceptance.     Dental Advisory Given  Plan Discussed with: Anesthesiologist, CRNA and Surgeon  Anesthesia Plan Comments:        Anesthesia Quick Evaluation

## 2022-07-10 NOTE — Discharge Instructions (Addendum)
Post-Surgery Instructions  1. If you are recuperating from surgery anywhere other than home, please be sure to leave [place] a number where you can be reached. 2. Go directly home and rest. 3. The keep operated foot (or feet) elevated six inches above the hip when sitting or lying down. 4. Support the elevated foot and leg with pillows under the calf. DO NOT PLACE PILLOWS UNDER THE KNEE. 5. DO NOT REMOVE or get your bandages wet. This will increase your chances of getting an infection. 6. Wear your surgical shoe at all times when you are up. 7. A limited amount of pain and swelling may occur. The skin may take on a bruised appearance. This is no cause for alarm. 8. For slight pain and swelling, apply an ice pack directly over the bandage for 15 minutes every hour. Continue icing until seen in the office. DO NOT apply any form of heat to the area. 9. Have prescription(s) filled immediately and take as directed. 10. Drink lots of liquids, water, and juice. 11. CALL THE OFFICE IMMEDIATELY IF: a. Bleeding continues b. Pain increases and/or does not respond to medication c. Bandage or cast appears too tight d. Any liquids (water, coffee, etc.) have spilled on your bandages. e. Tripping, falling, or stubbing the surgical foot f. If your temperature rises above 101 g. If you have ANY questions at all 12. Please use the crutches, knee scooter, or walker you have prescribed, rented, or purchased. If you are non-weight bearing DO NOT put weight on the operated foot for _________ days. If you are weight-bearing, follow your physician's instructions. You are expected to be: ? weight-bearing ? non-weight bearing 13. Special Instructions: _____________________________________________________________ _________________________________________________________________________________ _________________________________________________________________________________  14. Your next appointment is: 07/16/2022  8:15 AM  If you need to reach the nurse for any reason, please call: [place]: [phone] [place]: [phone] [place]: [phone] AMBULATORY SURGERY  DISCHARGE INSTRUCTIONS   The drugs that you were given will stay in your system until tomorrow so for the next 24 hours you should not:  Drive an automobile Make any legal decisions Drink any alcoholic beverage   You may resume regular meals tomorrow.  Today it is better to start with liquids and gradually work up to solid foods.  You may eat anything you prefer, but it is better to start with liquids, then soup and crackers, and gradually work up to solid foods.   Please notify your doctor immediately if you have any unusual bleeding, trouble breathing, redness and pain at the surgery site, drainage, fever, or pain not relieved by medication.    Additional Instructions:        Please contact your physician with any problems or Same Day Surgery at [phone], Monday through Friday 6 am to 4 pm, or [place] at [facility] number at [phone].

## 2022-07-10 NOTE — Anesthesia Procedure Notes (Signed)
Anesthesia Regional Block: Adductor canal block   Pre-Anesthetic Checklist: , timeout performed,  Correct Patient, Correct Site, Correct Laterality,  Correct Procedure, Correct Position, site marked,  Risks and benefits discussed,  Surgical consent,  Pre-op evaluation,  At surgeon's request and post-op pain management  Laterality: Left  Prep: chloraprep       Needles:  Injection technique: Single-shot  Needle Type: Stimiplex     Needle Length: 9cm  Needle Gauge: 22     Additional Needles:   Procedures:,,,, ultrasound used (permanent image in chart),,    Narrative:  Start time: 07/10/2022 7:15 AM End time: 07/10/2022 7:19 AM Injection made incrementally with aspirations every 5 mL.  Performed by: Personally  Anesthesiologist: Iran Ouch, MD  Additional Notes: Patient consented for risk and benefits of nerve block including but not limited to nerve damage, failed block, bleeding and infection.  Patient voiced understanding.  Functioning IV was confirmed and monitors were applied.  Timeout done prior to procedure and prior to any sedation being given to the patient.  Patient confirmed procedure site prior to any sedation given to the patient. Sterile prep,hand hygiene and sterile gloves were used.  Minimal sedation used for procedure.  No paresthesia endorsed by patient during the procedure.  Negative aspiration and negative test dose prior to incremental administration of local anesthetic. The patient tolerated the procedure well with no immediate complications.

## 2022-07-10 NOTE — Anesthesia Postprocedure Evaluation (Signed)
Anesthesia Post Note  Patient: Patrick Gallagher  Procedure(s) Performed: ACHILLES TENDON REPAIR (Left) GASTROC RECESSION EXTREMITY (Left) CALCANEAL OSTEOTOMY (Left)  Patient location during evaluation: PACU Anesthesia Type: General Level of consciousness: awake and alert Pain management: pain level controlled Vital Signs Assessment: post-procedure vital signs reviewed and stable Respiratory status: spontaneous breathing, nonlabored ventilation and respiratory function stable Cardiovascular status: blood pressure returned to baseline and stable Postop Assessment: no apparent nausea or vomiting Anesthetic complications: no   No notable events documented.   Last Vitals:  Vitals:   07/10/22 1041 07/10/22 1043  BP:  140/88  Pulse: 89   Resp: 18   Temp: 36.7 C   SpO2: 97%     Last Pain:  Vitals:   07/10/22 1041  PainSc: [place]

## 2022-07-10 NOTE — Anesthesia Procedure Notes (Signed)
Anesthesia Regional Block: Popliteal block   Pre-Anesthetic Checklist: , timeout performed,  Correct Patient, Correct Site, Correct Laterality,  Correct Procedure, Correct Position, site marked,  Risks and benefits discussed,  Surgical consent,  Pre-op evaluation,  At surgeon's request and post-op pain management  Laterality: Left  Prep: chloraprep       Needles:  Injection technique: Single-shot  Needle Type: Stimiplex     Needle Length: 9cm  Needle Gauge: 22     Additional Needles:   Procedures:,,,, ultrasound used (permanent image in chart),,    Narrative:  Start time: 07/10/2022 7:15 AM End time: 07/10/2022 7:22 AM Injection made incrementally with aspirations every 5 mL.  Performed by: Personally  Anesthesiologist: [place] Ouch, MD  Additional Notes: Patient consented for risk and benefits of nerve block including but not limited to nerve damage, failed block, bleeding and infection.  Patient voiced understanding.  Functioning IV was confirmed and monitors were applied.  Timeout done prior to procedure and prior to any sedation being given to the patient.  Patient confirmed procedure site prior to any sedation given to the patient. Sterile prep,hand hygiene and sterile gloves were used.  Minimal sedation used for procedure.  No paresthesia endorsed by patient during the procedure.  Negative aspiration and negative test dose prior to incremental administration of local anesthetic. The patient tolerated the procedure well with no immediate complications.

## 2022-07-10 NOTE — Telephone Encounter (Signed)
Patient is requesting an alternative medication for the xarelto, too expensive(out of pocket-530). Please advise.

## 2022-07-11 DIAGNOSIS — M9262 Juvenile osteochondrosis of tarsus, left ankle: Secondary | ICD-10-CM

## 2022-07-11 DIAGNOSIS — M21862 Other specified acquired deformities of left lower leg: Secondary | ICD-10-CM

## 2022-07-11 NOTE — Op Note (Signed)
Patient Name: Patrick Gallagher DOB: [birth year]  MRN: [id number]   Date of Service: 07/10/2022  Surgeon: Dr. 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 Item Serial No. Manufacturer Lot No. LRB No. Used Action  KIT ANCHR SUT SONIC FF2 2.5X10 - [id number] Anchor KIT ANCHR SUT SONIC FF2 2.5X10  [employer] [id number] Left 1 Implanted  KIT ANCHR SUT SONIC FF2 2.5X10 - [id number] Anchor KIT ANCHR SUT SONIC FF2 2.5X10  [employer] [id number] Left 1 Implanted  KIT ANCHR SUT SONIC FF2 2.5X10 - [id number] Anchor KIT ANCHR SUT SONIC FF2 2.5X10  [employer] [id number] Left 1 Implanted  KIT ANCHR SUT SONIC FF2 2.5X10 - [id number] Anchor KIT ANCHR SUT SONIC FF2 2.5X10  [employer] [id number] Left 1 Implanted  KIT ANCHR SUT SONIC FF2 2.5X10 - [id number] Anchor KIT ANCHR SUT SONIC FF2 2.5X10  [employer] [id number] Left 1 Implanted   Medications: No medications given Complications: No complication noted  Indications for Procedure:  This is a 41 y.o. male with a history of severe Achilles tendinosis and-deformity as well as gastrocnemius equinus.  He exhausted all nonsurgical treatment options and elected for surgical repair.  All risk benefits symptoms complications were  discussed prior to surgery.  Informed consent was signed and reviewed prior to surgery.   Procedure in Detail: Patient was identified in pre-operative holding area. Formal consent was signed and the left lower extremity was marked. Patient was brought back to the operating room. Anesthesia was induced. The extremity was prepped and draped in the usual sterile fashion. Timeout was taken to confirm patient name, laterality, and procedure prior to incision.   I began by directing my attention to the mid-calf, where a linear incision was made over the aponeurosis of the gastrocnemius muscle belly medially. The fascial elevator was used to raise the soft tissue from the gastrocnemius. The blunt trocar and cannula was inserted and passed laterally and a counter incision was made. The camera was introduced and the aponeurosis was visualized well.  The hook blade was used to create a Strayer style  recession of the aponeurosis with gentle dorsiflexion on the foot with good correction of the contracture  noted.  I then directed attention to the distal Achilles tendon and the posterior calcaneus where a linear incision was made extending from the glabrous junction of the heel to the midsubstance of the Achilles tendon. This was carried through subcutaneous tissue, cauterizing bleeding vessels as necessary. The paratenon was identified and incised and reflected, and the distal Achilles tendon was dissected medially and laterally. It attached to a large spur distally and this was resected using an osteotome and sagittal saw and smoothed with a power rasp. The Achilles tendon was reflected from the posterior calcaneus superiorly as well and the Haglund's deformity was identified. This was then resected using an osteotome and sagittal saw and smoothed with a power rasp. The Achilles tendon exhibited significant degenerative  changes with fatty infiltration and intratendinous calcifications. They were mostly localized to the  distal deep fibers of the tendon. The remainder appeared healthy. These were debrided from the healthy tendon and it was reduced in bulk and thickness with a scalpel.  Once good resection was confirmed with fluoroscopy the wound was thoroughly irrigated with saline. [employer] anchors were then placed in a 2x2 pattern with crossing ForceFiber suture through the Achilles tendon to create a knotless anchor construct. This was also sutured to the medial and lateral periosteum and deep fascia using 2-0 vicryl. The wound was then closed in layers using 3-0 monocryl, 3-0 nylon and skin staples. It was dressed with xeroform, 4x4 gauze, ABD pad, kerlix, and an ACE wrap. A well padded posterior below knee splint was applied with the foot in gentle plantarflexion.  The thigh tourniquet was deflated after 17 minutes after developing a venous tourniquet.  This was released and an Esmarch bandage around the ankle was used for the remainder of the procedure for an additional 60 minutes.  Following release of this there was immediate return of peripheral pulses and capillary fill time.  He was returned to the supine position and extubated and taken to the recovery area.  The patient tolerated the procedure well.   Disposition: Following a period of post-operative monitoring, patient will be transferred to home.

## 2022-07-13 ENCOUNTER — Encounter: Payer: Self-pay | Admitting: Podiatry

## 2022-07-13 ENCOUNTER — Other Ambulatory Visit: Payer: Self-pay

## 2022-07-14 MED ORDER — OXYCODONE HCL 5 MG PO TABS: 5.0000 mg | ORAL_TABLET | ORAL | 0 refills | Status: AC | PRN

## 2022-07-16 ENCOUNTER — Ambulatory Visit (INDEPENDENT_AMBULATORY_CARE_PROVIDER_SITE_OTHER): Payer: Commercial Managed Care - PPO | Admitting: Podiatry

## 2022-07-16 ENCOUNTER — Ambulatory Visit (INDEPENDENT_AMBULATORY_CARE_PROVIDER_SITE_OTHER): Payer: Commercial Managed Care - PPO

## 2022-07-16 ENCOUNTER — Encounter: Payer: Self-pay | Admitting: Podiatry

## 2022-07-16 DIAGNOSIS — M6788 Other specified disorders of synovium and tendon, other site: Secondary | ICD-10-CM

## 2022-07-16 DIAGNOSIS — M21862 Other specified acquired deformities of left lower leg: Secondary | ICD-10-CM | POA: Diagnosis not present

## 2022-07-16 DIAGNOSIS — M7662 Achilles tendinitis, left leg: Secondary | ICD-10-CM | POA: Diagnosis not present

## 2022-07-16 MED ORDER — RIVAROXABAN 10 MG PO TABS: 10.0000 mg | ORAL_TABLET | Freq: Every day | ORAL | 0 refills | Status: DC

## 2022-07-16 NOTE — Progress Notes (Signed)
  Subjective:  Patient ID: Patrick Gallagher, male    DOB: 1980/12/28,  MRN: [id number]  Chief Complaint  Patient presents with   Routine Post Op      POV #1 DOS 07/10/22  --- REPAIR OF ACHILLES TENDON, REMOVAL OF BONE SPURS, REATTACHMENT OF ACHILLES, CALF MUSCLE LENGTHENING LEFT LEG    41 y.o. male returns for post-op check.  Overall doing well his pain is improving most the pain is in the back of the heel  Review of Systems: Negative except as noted in the HPI. Denies N/V/F/Ch.   Objective:  There were no vitals filed for this visit. There is no height or weight on file to calculate BMI. Constitutional Well developed. Well nourished.  Vascular Foot warm and well perfused. Capillary refill normal to all digits.  Calf is soft and supple, no posterior calf or knee pain, negative Homans' sign  Neurologic Normal speech. Oriented to person, place, and time. Epicritic sensation to light touch grossly present bilaterally.  Dermatologic Skin healing well without signs of infection. Skin edges well coapted without signs of infection.  Orthopedic: Tenderness to palpation noted about the surgical site.   Multiple view plain film radiographs: Good resection of calcaneal enthesophyte and spurring Assessment:   1. Gastrocnemius equinus of left lower extremity   2. Achilles tendinitis of left lower extremity    Plan:  Patient was evaluated and treated and all questions answered.  S/p foot surgery left -Progressing as expected post-operatively. -XR: Noted above -WB Status: NWB in posterior splint -Sutures: Plan remove in 2 weeks. -Medications: No refills required.  He did end up starting the Xarelto and paid out of pocket for it -Foot redressed.  Posterior well-padded below the knee splint applied  No follow-ups on file.

## 2022-07-22 MED ORDER — OXYCODONE HCL 5 MG PO TABS: 5.0000 mg | ORAL_TABLET | ORAL | 0 refills | Status: AC | PRN

## 2022-07-30 ENCOUNTER — Ambulatory Visit (INDEPENDENT_AMBULATORY_CARE_PROVIDER_SITE_OTHER): Payer: Commercial Managed Care - PPO | Admitting: Podiatry

## 2022-07-30 ENCOUNTER — Encounter: Payer: Self-pay | Admitting: Podiatry

## 2022-07-30 DIAGNOSIS — M7662 Achilles tendinitis, left leg: Secondary | ICD-10-CM

## 2022-07-30 DIAGNOSIS — M21862 Other specified acquired deformities of left lower leg: Secondary | ICD-10-CM

## 2022-07-30 NOTE — Progress Notes (Signed)
  Subjective:  Patient ID: Patrick Gallagher, male    DOB: 07-03-1981,  MRN: [id number]  Chief Complaint  Patient presents with   Routine Post Op     POV #2 DOS 07/10/22  --- REPAIR OF ACHILLES TENDON, REMOVAL OF BONE SPURS, REATTACHMENT OF ACHILLES, CALF MUSCLE LENGTHENING LEFT LEG    41 y.o. male returns for post-op check.  Overall doing well pain continues to improve mostly was the splint that was hurting  Review of Systems: Negative except as noted in the HPI. Denies N/V/F/Ch.   Objective:  There were no vitals filed for this visit. There is no height or weight on file to calculate BMI. Constitutional Well developed. Well nourished.  Vascular Foot warm and well perfused. Capillary refill normal to all digits.  Calf is soft and supple, no posterior calf or knee pain, negative Homans' sign  Neurologic Normal speech. Oriented to person, place, and time. Epicritic sensation to light touch grossly present bilaterally.  Dermatologic Skin healing well without signs of infection. Skin edges well coapted without signs of infection.  Orthopedic: Tenderness to palpation noted about the surgical site.   Multiple view plain film radiographs: Good resection of calcaneal enthesophyte and spurring Assessment:   No diagnosis found.  Plan:  Patient was evaluated and treated and all questions answered.  S/p foot surgery left -Overall doing well.  All sutures removed today, every other staple was removed, the remainder left intact.  Plan to remove these in 2 weeks.  Transition to CAM boot with plantarflexors wedges, continue nonweightbearing.  He may begin physical therapy the week of 07/10/2022 for nonweightbearing range of motion exercises, plan to transition to weightbearing exercise 08/24/2022.  Continue Xarelto until course completed  Return in about 3 weeks (around 08/20/2022) for post op (no x-rays); staple removal.

## 2022-08-06 ENCOUNTER — Ambulatory Visit (INDEPENDENT_AMBULATORY_CARE_PROVIDER_SITE_OTHER): Payer: Commercial Managed Care - PPO | Admitting: Podiatry

## 2022-08-06 DIAGNOSIS — T8131XA Disruption of external operation (surgical) wound, not elsewhere classified, initial encounter: Secondary | ICD-10-CM

## 2022-08-06 MED ORDER — CIPROFLOXACIN HCL 500 MG PO TABS: 500.0000 mg | ORAL_TABLET | Freq: Two times a day (BID) | ORAL | 0 refills | Status: AC

## 2022-08-06 MED ORDER — SANTYL 250 UNIT/GM EX OINT: 1.0000 | TOPICAL_OINTMENT | Freq: Every day | CUTANEOUS | 0 refills | Status: DC

## 2022-08-06 MED ORDER — DOXYCYCLINE HYCLATE 100 MG PO TABS: 100.0000 mg | ORAL_TABLET | Freq: Two times a day (BID) | ORAL | 0 refills | Status: DC

## 2022-08-09 NOTE — Progress Notes (Signed)
  Subjective:  Patient ID: Patrick Gallagher, male    DOB: 1981/06/30,  MRN: [id number]  Chief Complaint  Patient presents with   Post-op Problem    URGENT WORK IN - incision is goopy and draining, looks like one     41 y.o. male returns for post-op check.  Returns for urgent visit, noted that the staples were pulling through and there was drainage  Review of Systems: Negative except as noted in the HPI. Denies N/V/F/Ch.   Objective:  There were no vitals filed for this visit. There is no height or weight on file to calculate BMI. Constitutional Well developed. Well nourished.  Vascular Foot warm and well perfused. Capillary refill normal to all digits.  Calf is soft and supple, no posterior calf or knee pain, negative Homans' sign  Neurologic Normal speech. Oriented to person, place, and time. Epicritic sensation to light touch grossly present bilaterally.  Dermatologic Proximal portion of incision is healing, the distal 30% has dehiscence with fibrotic base, no exposed tendon.  No cellulitis purulence or malodor  Orthopedic: Tenderness to palpation noted about the surgical site.       Multiple view plain film radiographs: Good resection of calcaneal enthesophyte and spurring Assessment:   1. Dehiscence of operative wound, initial encounter     Plan:  Patient was evaluated and treated and all questions answered.  S/p foot surgery left -Unfortunate has suffered a distal wound dehiscence.  I recommended beginning local wound care with [name] ointment.  We will leave the staples intact for now I think they are helping to hold some of the deeper tissues together.  Doxycycline and ciprofloxacin Rx sent to pharmacy.  I will see him back in 1 week for follow-up.   No follow-ups on file.

## 2022-08-12 ENCOUNTER — Ambulatory Visit: Payer: Commercial Managed Care - PPO | Admitting: Physician Assistant

## 2022-08-12 ENCOUNTER — Ambulatory Visit (INDEPENDENT_AMBULATORY_CARE_PROVIDER_SITE_OTHER): Payer: Commercial Managed Care - PPO | Admitting: Podiatry

## 2022-08-12 DIAGNOSIS — T8131XA Disruption of external operation (surgical) wound, not elsewhere classified, initial encounter: Secondary | ICD-10-CM

## 2022-08-12 NOTE — Progress Notes (Signed)
  Subjective:  Patient ID: Patrick Gallagher, male    DOB: May 09, 1981,  MRN: [id number]  Chief Complaint  Patient presents with   Wound Dehiscence    Post op follow up left ankle, wound check - looks better today. Lidocaine cream applied per Dr. Sherryle Lis for staple removal    41 y.o. male returns for post-op check.  Feels he is doing better has been taking the antibiotics using the [name] daily now  Review of Systems: Negative except as noted in the HPI. Denies N/V/F/Ch.   Objective:  There were no vitals filed for this visit. There is no height or weight on file to calculate BMI. Constitutional Well developed. Well nourished.  Vascular Foot warm and well perfused. Capillary refill normal to all digits.  Calf is soft and supple, no posterior calf or knee pain, negative Homans' sign  Neurologic Normal speech. Oriented to person, place, and time. Epicritic sensation to light touch grossly present bilaterally.  Dermatologic Improve in appearance with reduction in the amount of fibrotic tissue, no erythema today no drainage  Orthopedic: Tenderness to palpation noted about the surgical site.       Multiple view plain film radiographs: Good resection of calcaneal enthesophyte and spurring Assessment:   1. Dehiscence of operative wound, initial encounter      Plan:  Patient was evaluated and treated and all questions answered.  S/p foot surgery left -Overall doing much better.  Was able to provide some light debridement of the overlying eschar.  Continue [name] use daily with saline wet-to-dry dressings.  Staples were removed.  Complete antibiotics.  I will see him back in 1 week follow-up   No follow-ups on file.

## 2022-08-17 IMAGING — MR MR ANKLE*L* W/O CM
4 of 6 series · 12 of 40 positions shown · non-contrast
Comparison: Left foot radiographs 05/26/2021 and 12/24/2021

EXAM:
MRI OF THE LEFT ANKLE WITHOUT CONTRAST
TECHNIQUE: Multiplanar, multisequence MR imaging of the ankle was performed. No
intravenous contrast was administered.

[Series 3: PD fat-sat · axial · left · 3.0mm · 0.28mm/px · z∈[-68,+11]mm · 3 of 30 slices shown]
[im 5/30]
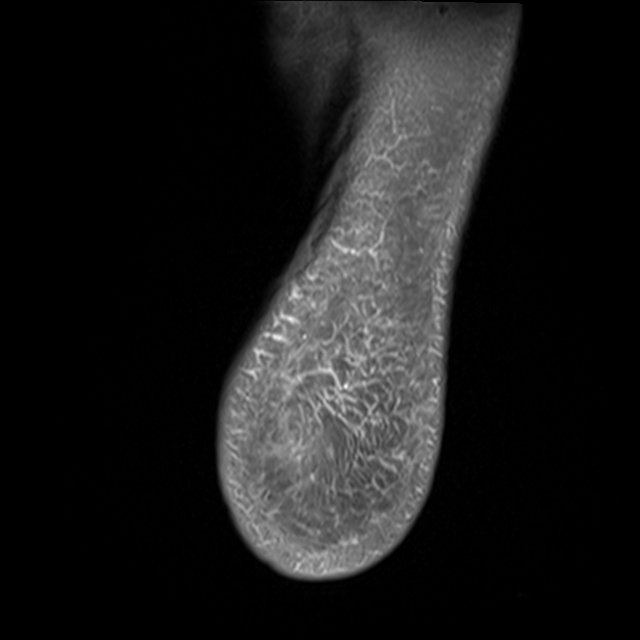
[im 15/30]
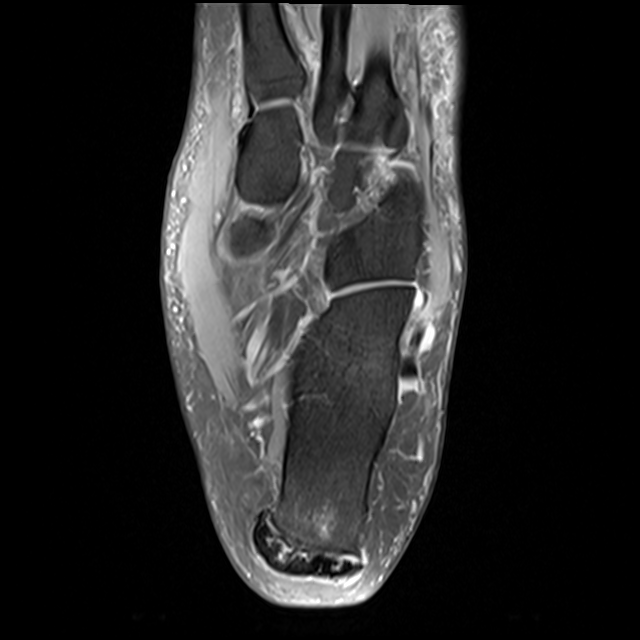
[im 25/30]
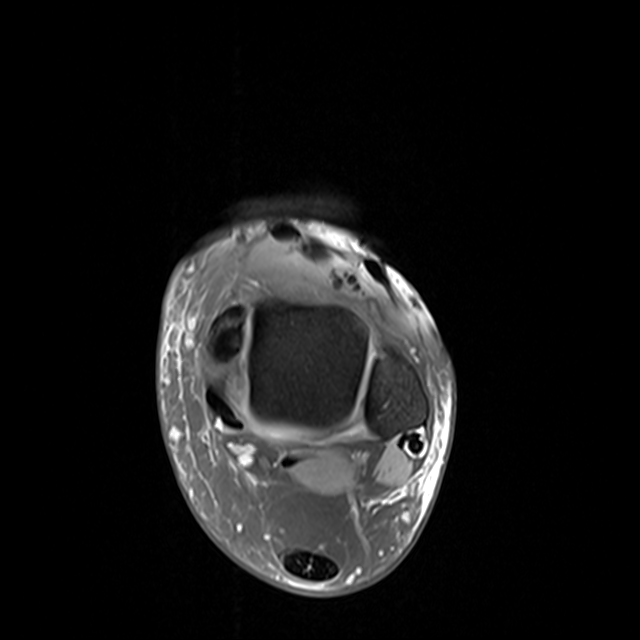

[Series 4: T2 fat-sat · axial · left · 3.0mm · 0.28mm/px · z∈[-68,+11]mm · 3 of 30 slices shown (1 of 2)]
[im 5/30]
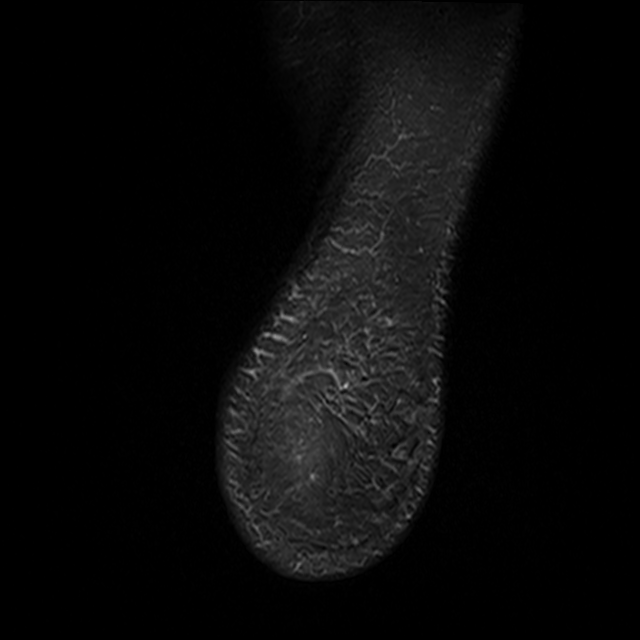
[im 15/30]
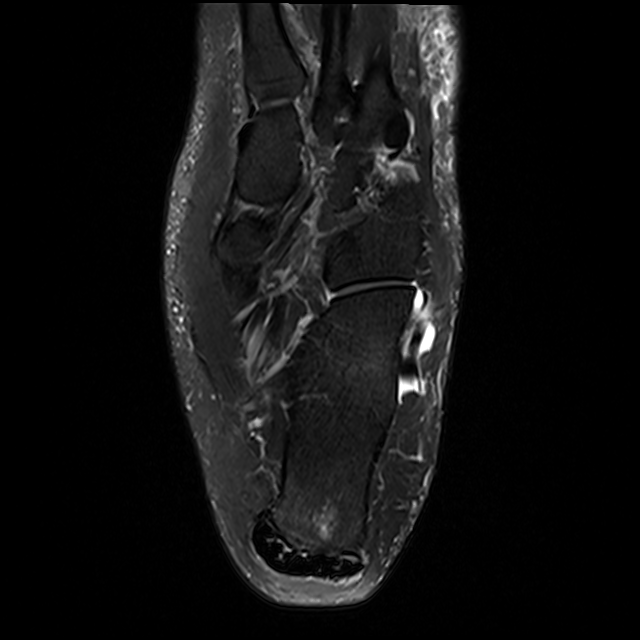
[im 25/30]
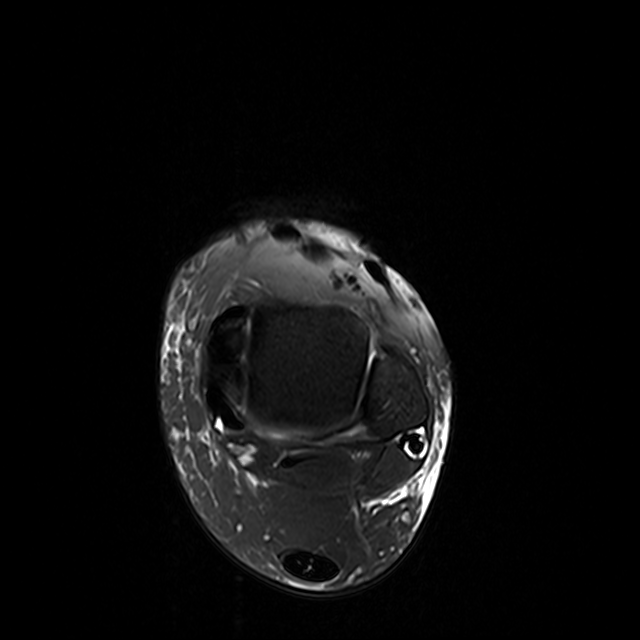

[Series 5: T1 · sagittal · left · 4.0mm · 0.27mm/px · 3 of 24 slices shown]
[im 1/24]
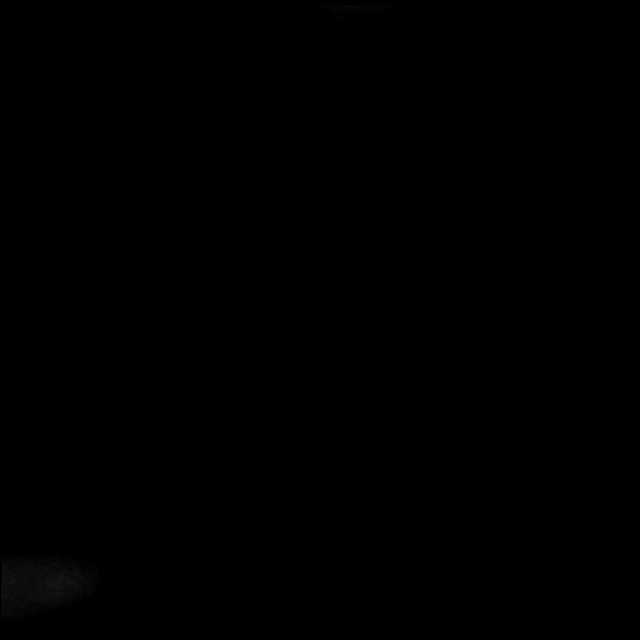
[im 12/24]
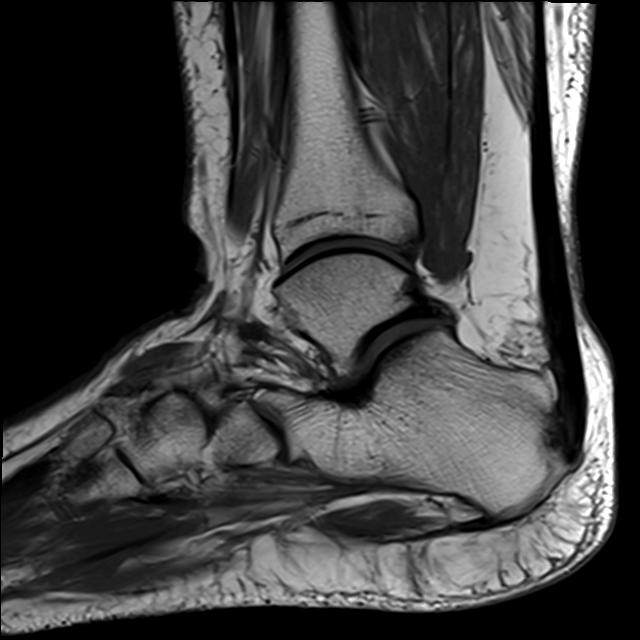
[im 24/24]
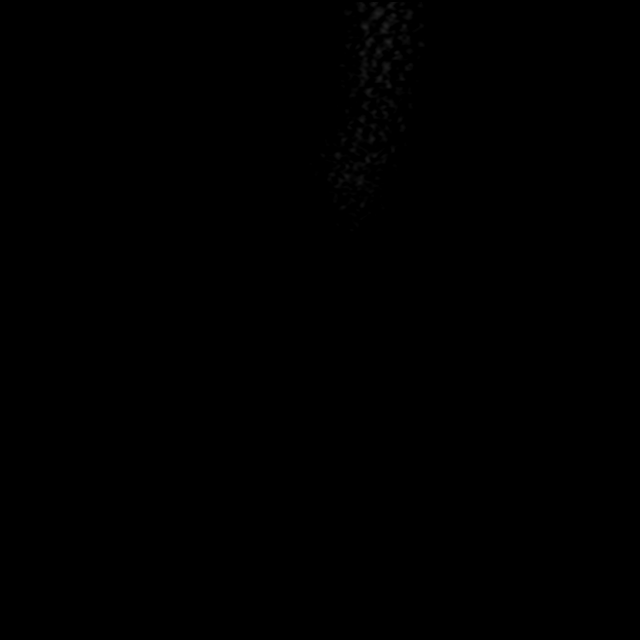

[Series 7: T2 fat-sat · coronal · left · 3.0mm · 0.28mm/px · 3 of 38 slices shown (2 of 2)]
[im 6/38]
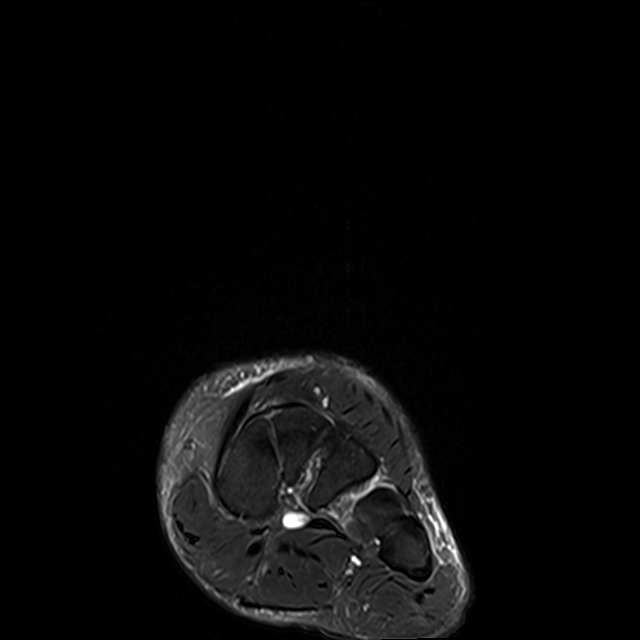
[im 22/38]
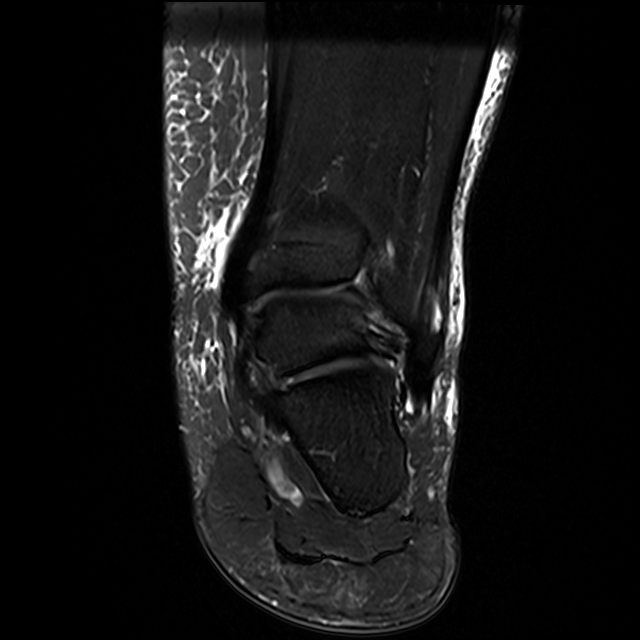
[im 32/38]
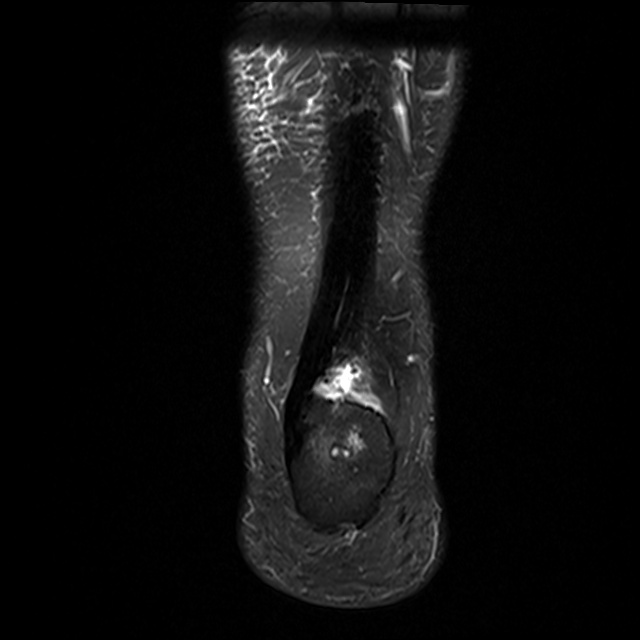

[12 of 40 positions shown; findings below may reference images not displayed]

FINDINGS: TENDONS

Peroneal: Mild-to-moderate peroneus longus and brevis tenosynovitis.
No tendon tear is seen.

Posteromedial: Intact tibialis posterior, flexor digitorum longus,
and flexor hallucis longus tendons.

Anterior: The tibialis anterior, extensor hallucis longus and
extensor digitorum longus tendons are intact.

Achilles: There is mild thickening and intermediate T2 signal
tendinosis of the distal 4 cm of the Achilles tendon insertion.
There is moderate intermediate T2 signal Achilles tendinosis
throughout this region. At the distal most Achilles tendon insertion
there are multiple small regions of fluid bright signal within the
tendon midsubstance each measuring up to only 2 mm in transverse
dimension and up to 5 mm in craniocaudal dimension (sagittal images
10 through 15, axial images 17 and 18). There are moderate marrow
edema and cystic changes within the posterosuperior calcaneus at the
Achilles tendon insertion.

Plantar Fascia: Intact.

LIGAMENTS

Lateral: The anterior and posterior talofibular, anterior and
posterior tibiofibular, and calcaneofibular ligaments are intact.

Medial: The tibiotalar deep deltoid and tibial spring ligaments are
intact.

CARTILAGE

Ankle Joint: Intact cartilage.

Subtalar Joints/Sinus Tarsi: Fat is preserved within sinus tarsi.

Bones: Normal marrow signal. No acute fracture.

Other: Mild medial ankle subcutaneous fat edema and swelling. Mild
edema within the lateral ankle soft tissues. The tarsal tunnel is
unremarkable. The Lisfranc ligament complex is intact.
IMPRESSION: :
IMPRESSION: 1. Moderate diffuse distal Achilles tendinosis. There are
approximally 4 regions of tiny partial-thickness tears within the
midsubstance of the far distal Achilles tendon insertion (axial
image 18) as described above. No tendon retraction. Moderate marrow
edema and cystic change within the underlying calcaneus, likely
acute on chronic stress related traction changes.
2. Mild-to-moderate peroneus longus and brevis tenosynovitis.

## 2022-08-20 ENCOUNTER — Ambulatory Visit (INDEPENDENT_AMBULATORY_CARE_PROVIDER_SITE_OTHER): Payer: Commercial Managed Care - PPO

## 2022-08-20 ENCOUNTER — Ambulatory Visit (INDEPENDENT_AMBULATORY_CARE_PROVIDER_SITE_OTHER): Payer: Commercial Managed Care - PPO | Admitting: Podiatry

## 2022-08-20 DIAGNOSIS — M7662 Achilles tendinitis, left leg: Secondary | ICD-10-CM

## 2022-08-20 DIAGNOSIS — T8131XA Disruption of external operation (surgical) wound, not elsewhere classified, initial encounter: Secondary | ICD-10-CM

## 2022-08-20 DIAGNOSIS — M21862 Other specified acquired deformities of left lower leg: Secondary | ICD-10-CM

## 2022-08-23 NOTE — Progress Notes (Signed)
  Subjective:  Patient ID: Patrick Gallagher, male    DOB: 1981/05/03,  MRN: [id number]  Chief Complaint  Patient presents with   Routine Post Op     staple removal POV #3 DOS 07/10/22  --- REPAIR OF ACHILLES TENDON, REMOVAL OF BONE SPURS, REATTACHMENT OF ACHILLES, CALF MUSCLE LENGTHENING LEFT LEG    41 y.o. male returns for post-op check.  He is [name] daily  Review of Systems: Negative except as noted in the HPI. Denies N/V/F/Ch.   Objective:  There were no vitals filed for this visit. There is no height or weight on file to calculate BMI. Constitutional Well developed. Well nourished.  Vascular Foot warm and well perfused. Capillary refill normal to all digits.  Calf is soft and supple, no posterior calf or knee pain, negative Homans' sign  Neurologic Normal speech. Oriented to person, place, and time. Epicritic sensation to light touch grossly present bilaterally.  Dermatologic Improve in appearance with reduction in the amount of fibrotic tissue, no erythema today no drainage.  [name] appears to be penetrating the fibrosis.  It measures 2.5 x 1.5 x 0.4 cm.  Orthopedic: Tenderness to palpation noted about the surgical site.        Multiple view plain film radiographs:  Today show no interval change there is no evidence of emphysema or osteomyelitis Assessment:   1. Dehiscence of operative wound, initial encounter   2. Achilles tendinitis of left lower extremity   3. Gastrocnemius equinus of left lower extremity      Plan:  Patient was evaluated and treated and all questions answered.  S/p foot surgery left -Continues to improve.  The wound was debrided of nonviable tissue following topical anesthetic administration with lidocaine cream.  They will continue the [name] administration for the next 10 days and then transition to Prisma collagen matrix applications   No follow-ups on file.

## 2022-09-01 ENCOUNTER — Ambulatory Visit: Payer: Commercial Managed Care - PPO

## 2022-09-01 ENCOUNTER — Ambulatory Visit (INDEPENDENT_AMBULATORY_CARE_PROVIDER_SITE_OTHER): Payer: Commercial Managed Care - PPO | Admitting: Podiatry

## 2022-09-01 DIAGNOSIS — T8131XA Disruption of external operation (surgical) wound, not elsewhere classified, initial encounter: Secondary | ICD-10-CM

## 2022-09-01 NOTE — Progress Notes (Signed)
Chief Complaint  Patient presents with   Follow-up    Patient is here for follow-up for left foot, patient states that the site is healing well.    Subjective:  Patient presents today status post posterior heel spur exostectomy with repair of Achilles tendon left performed by Dr. Sherryle Lis. DOS: 07/10/2022 with subsequent wound dehiscence along the posterior heel.  Patient states that he is feeling well.  He has been nonweightbearing to the surgical extremity in the cam boot with a knee scooter.  Currently the patient states that he is doing very well and he is applying the Prisma collagen dressing daily to the wound with a light dressing.  Past Medical History:  Diagnosis Date   Achilles tendon disorder    Morbid obesity ([place])    Tendon injury    right forearm bicep   Tubular adenoma of colon 05/2016    Past Surgical History:  Procedure Laterality Date   ACHILLES TENDON SURGERY Left 07/10/2022   Procedure: ACHILLES TENDON REPAIR;  Surgeon: [name], DPM;  Location: [facility];  Service: Podiatry;  Laterality: Left;   CALCANEAL OSTEOTOMY Left 07/10/2022   Procedure: CALCANEAL OSTEOTOMY;  Surgeon: [name], DPM;  Location: [facility];  Service: Podiatry;  Laterality: Left;   COLONOSCOPY WITH PROPOFOL N/A 06/09/2016   Procedure: COLONOSCOPY WITH PROPOFOL;  Surgeon: Ladene Artist, MD;  Location: [facility];  Service: Endoscopy;  Laterality: N/A;   COLONOSCOPY WITH PROPOFOL N/A 10/31/2019   Procedure: COLONOSCOPY WITH PROPOFOL;  Surgeon: Ladene Artist, MD;  Location: [facility];  Service: Endoscopy;  Laterality: N/A;   FOOT SURGERY     as a child   GASTROC RECESSION EXTREMITY Left 07/10/2022   Procedure: GASTROC RECESSION EXTREMITY;  Surgeon: [name], DPM;  Location: [facility];  Service: Podiatry;  Laterality: Left;   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 last one 09/08   URETHROPLASTY      No Known Allergies     Objective/Physical  Exam Neurovascular status intact.  Skin incisions appear to be well coapted and healed with the exception of the distal portion of the incision site which has an open wound with a healthy granular wound base.  No drainage.  Periwound is intact.  Please see above noted photo.  Clinically there is no indication of infection  Assessment: 1. s/p posterior heel spur resection with repair of Achilles tendon left. DOS: 07/10/2022 2.  Incisional wound dehiscence left posterior heel   Plan of Care:  1. Patient was evaluated.  Overall well-healing wound to the posterior aspect of the heel 2.  Continue collagen dressing with light dressing daily or every other day 3.  Patient may now begin to weight-bear in the cam boot with the heel lift which has been applied within the cam boot.  Apparently this has been discussed with Dr. Sherryle Lis on the previous appointment 4.  Continue the knee scooter if the patient is going to be on his feet for prolonged period of time 5.  Return to clinic next scheduled appointment with Dr. Merri Brunette, DPM [facility]  Dr. Edrick Kins, DPM    [address], [place] [address]  Office [phone]  Fax [phone]

## 2022-09-07 ENCOUNTER — Encounter: Payer: Self-pay | Admitting: *Deleted

## 2022-09-15 ENCOUNTER — Ambulatory Visit (INDEPENDENT_AMBULATORY_CARE_PROVIDER_SITE_OTHER): Payer: Commercial Managed Care - PPO | Admitting: Podiatry

## 2022-09-15 DIAGNOSIS — T8131XA Disruption of external operation (surgical) wound, not elsewhere classified, initial encounter: Secondary | ICD-10-CM | POA: Diagnosis not present

## 2022-09-19 NOTE — Progress Notes (Signed)
  Subjective:  Patient ID: Patrick Gallagher, male    DOB: 1981-11-22,  MRN: [id number]  POV #4 DOS 07/10/22  --- REPAIR OF ACHILLES TENDON, REMOVAL OF BONE SPURS, REATTACHMENT OF ACHILLES, CALF MUSCLE LENGTHENING LEFT LEG   41 y.o. male returns for post-op check.  He has been using the Prisma collagen  Review of Systems: Negative except as noted in the HPI. Denies N/V/F/Ch.   Objective:  There were no vitals filed for this visit. There is no height or weight on file to calculate BMI. Constitutional Well developed. Well nourished.  Vascular Foot warm and well perfused. Capillary refill normal to all digits.  Calf is soft and supple, no posterior calf or knee pain, negative Homans' sign  Neurologic Normal speech. Oriented to person, place, and time. Epicritic sensation to light touch grossly present bilaterally.  Dermatologic Improve in appearance with reduction in the amount of fibrotic tissue, no erythema today no drainage.  [name] appears to be penetrating the fibrosis.  It measures 2.0 x 0.7 x 0.2 cm.  Orthopedic: Tenderness to palpation noted about the surgical site.        Multiple view plain film radiographs:  Today show no interval change there is no evidence of emphysema or osteomyelitis Assessment:   1. Dehiscence of operative wound, initial encounter       Plan:  Patient was evaluated and treated and all questions answered.  S/p foot surgery left -Continues to improve.  The wound was debrided of nonviable tissue following topical anesthetic administration with lidocaine cream.  They will continue the Prisma collagen matrix applications every 2 to 3 days.  Significant improvement today hopefully healed by next visit   Return in about 4 weeks (around 10/13/2022) for wound care.

## 2022-09-28 ENCOUNTER — Encounter: Payer: Self-pay | Admitting: Family Medicine

## 2022-09-28 ENCOUNTER — Ambulatory Visit (INDEPENDENT_AMBULATORY_CARE_PROVIDER_SITE_OTHER): Payer: Commercial Managed Care - PPO | Admitting: Family Medicine

## 2022-09-28 VITALS — BP 130/80 | HR 97 | Temp 98.1°F | Ht 69.0 in | Wt >= 6400 oz

## 2022-09-28 DIAGNOSIS — R7303 Prediabetes: Secondary | ICD-10-CM

## 2022-09-28 DIAGNOSIS — Z6841 Body Mass Index (BMI) 40.0 and over, adult: Secondary | ICD-10-CM

## 2022-09-28 LAB — LIPID PANEL
Cholesterol: 187 mg/dL (ref 0–200)
HDL: 35.8 mg/dL — ABNORMAL LOW (ref >39.00)
LDL Cholesterol: 118 mg/dL — ABNORMAL HIGH (ref 0–99)
NonHDL: 150.78
Total CHOL/HDL Ratio: 5
Triglycerides: 163 mg/dL — ABNORMAL HIGH (ref 0.0–149.0)
VLDL: 32.6 mg/dL (ref 0.0–40.0)

## 2022-09-28 LAB — TSH: TSH: 3.56 u[IU]/mL (ref 0.35–5.50)

## 2022-09-28 MED ORDER — WEGOVY 0.5 MG/0.5ML ~~LOC~~ SOAJ: 0.5000 mg | SUBCUTANEOUS | 0 refills | Status: DC

## 2022-09-28 NOTE — Progress Notes (Signed)
Subjective:     Patient ID: KONNAR BEN, male    DOB: [birth year], 41 y.o.   MRN: [id number]  Chief Complaint  Patient presents with   Follow-up    Follow up for post op also discuss weight loss.    HPI  Predm-not working on diet.  Not exercising d/t surgery on L ankle Morbid obesity-no fh thyroid ca.   Health Maintenance Due  Topic Date Due   COVID-19 Vaccine (1) Never done   HIV Screening  Never done   Hepatitis C Screening  Never done    Past Medical History:  Diagnosis Date   Achilles tendon disorder    Morbid obesity ([place])    Tendon injury    right forearm bicep   Tubular adenoma of colon 05/2016    Past Surgical History:  Procedure Laterality Date   ACHILLES TENDON SURGERY Left 07/10/2022   Procedure: ACHILLES TENDON REPAIR;  Surgeon: [name], DPM;  Location: [facility];  Service: Podiatry;  Laterality: Left;   CALCANEAL OSTEOTOMY Left 07/10/2022   Procedure: CALCANEAL OSTEOTOMY;  Surgeon: [name], DPM;  Location: [facility];  Service: Podiatry;  Laterality: Left;   COLONOSCOPY WITH PROPOFOL N/A 06/09/2016   Procedure: COLONOSCOPY WITH PROPOFOL;  Surgeon: Ladene Artist, MD;  Location: [facility];  Service: Endoscopy;  Laterality: N/A;   COLONOSCOPY WITH PROPOFOL N/A 10/31/2019   Procedure: COLONOSCOPY WITH PROPOFOL;  Surgeon: Ladene Artist, MD;  Location: [facility];  Service: Endoscopy;  Laterality: N/A;   FOOT SURGERY     as a child   GASTROC RECESSION EXTREMITY Left 07/10/2022   Procedure: GASTROC RECESSION EXTREMITY;  Surgeon: [name], DPM;  Location: [facility];  Service: Podiatry;  Laterality: Left;   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 last one 09/08   URETHROPLASTY      Outpatient Medications Prior to Visit  Medication Sig Dispense Refill   collagenase ([name]) 250 UNIT/GM ointment Apply 1 Application topically daily. 1.5cm x 3.5cm; apply daily for 30 days 30 g 0   doxycycline (VIBRA-TABS) 100  MG tablet Take 1 tablet (100 mg total) by mouth 2 (two) times daily. (Patient not taking: Reported on 09/28/2022) 28 tablet 0   gabapentin (NEURONTIN) 300 MG capsule Take 1 capsule (300 mg total) by mouth 3 (three) times daily for 7 days. 21 capsule 0   rivaroxaban (XARELTO) 10 MG TABS tablet Take 1 tablet (10 mg total) by mouth daily. (Patient not taking: Reported on 09/28/2022) 30 tablet 0   No facility-administered medications prior to visit.    No Known Allergies ROS neg/noncontributory except as noted HPI/below      Objective:     BP 130/80 (BP Location: Left Wrist, Patient Position: Sitting, Cuff Size: Large)   Pulse 97   Temp 98.1 F (36.7 C) (Temporal)   Ht '5\' 9"'$  (1.753 m)   Wt (!) 458 lb 6.4 oz (207.9 kg)   SpO2 97%   BMI 67.69 kg/m  Wt Readings from Last 3 Encounters:  09/28/22 (!) 458 lb 6.4 oz (207.9 kg)  07/10/22 (!) 440 lb (199.6 kg)  07/01/22 (!) 440 lb 3.2 oz (199.7 kg)    Physical Exam   Gen: WDWN NAD HEENT: NCAT, conjunctiva not injected, sclera nonicteric NECK:  supple, no thyromegaly, no nodes, no carotid bruits CARDIAC: RRR, S1S2+, no murmur. DP 2+B LUNGS: CTAB. No wheezes ABDOMEN:  BS+, soft, NTND, No HSM, no masses EXT:  no edema MSK:  no gross abnormalities.  NEURO: A&O x3.  CN II-XII intact.  PSYCH: normal mood. Good eye contact     Assessment & Plan:   Problem List Items Addressed This Visit       Other   Prediabetes   Other Visit Diagnoses     Morbid obesity with BMI of 60.0-69.9, adult ([place])    -  Primary   Relevant Medications   Semaglutide-Weight Management (WEGOVY) 0.5 MG/0.5ML SOAJ   Other Relevant Orders   TSH   Lipid panel      Morbid obesity-check TSH,lipids.  Work on diet.  Can't do lower body exercise yet.  Do upper body.  Wegovy 0.'5mg'$  weekly.  Message monthly for dose increases.  SED.  If not covered by insurance, will do phentermine 37.'5mg'$  and f/u   Mo, o/w, f/u 3 mo. PDMP checked PreDM-work on diet/exercise.     Meds ordered this encounter  Medications   Semaglutide-Weight Management (WEGOVY) 0.5 MG/0.5ML SOAJ    Sig: Inject 0.5 mg into the skin once a week.    Dispense:  2 mL    Refill:  0    Wellington Hampshire, MD

## 2022-09-28 NOTE — Patient Instructions (Signed)
It was very nice to see you today!  Send message monthly for doses   PLEASE NOTE:  If you had any lab tests please let us know if you have not heard back within a few days. You may see your results on MyChart before we have a chance to review them but we will give you a call once they are reviewed by [place]. If we ordered any referrals today, please let us know if you have not heard from their office within the next week.   Please try these tips to maintain a healthy lifestyle:  Eat most of your calories during the day when you are active. Eliminate processed foods including packaged sweets (pies, cakes, cookies), reduce intake of potatoes, white bread, white pasta, and white rice. Look for whole grain options, oat flour or almond flour.  Each meal should contain half fruits/vegetables, one quarter protein, and one quarter carbs (no bigger than a computer mouse).  Cut down on sweet beverages. This includes juice, soda, and sweet tea. Also watch fruit intake, though this is a healthier sweet option, it still contains natural sugar! Limit to 3 servings daily.  Drink at least 1 glass of water with each meal and aim for at least 8 glasses per day  Exercise at least 150 minutes every week.

## 2022-10-01 NOTE — Progress Notes (Signed)
Triglycerides a little elevated and HDL(good cholesterol) too low.  As discussed-work on diet, exercise.  Low sugar, starches,fats Thyroid may or may not be starting to be a problem.  Will monitor

## 2022-10-13 ENCOUNTER — Ambulatory Visit (INDEPENDENT_AMBULATORY_CARE_PROVIDER_SITE_OTHER): Payer: Commercial Managed Care - PPO | Admitting: Podiatry

## 2022-10-13 DIAGNOSIS — M62462 Contracture of muscle, left lower leg: Secondary | ICD-10-CM

## 2022-10-13 DIAGNOSIS — M7662 Achilles tendinitis, left leg: Secondary | ICD-10-CM | POA: Diagnosis not present

## 2022-10-13 NOTE — Patient Instructions (Signed)
Call [phone] to schedule your Physical Therapy at [facility]

## 2022-10-13 NOTE — Progress Notes (Signed)
  Subjective:  Patient ID: Patrick Gallagher, male    DOB: 12-Dec-1981,  MRN: [id number]  POV DOS 07/10/22  --- REPAIR OF ACHILLES TENDON, REMOVAL OF BONE SPURS, REATTACHMENT OF ACHILLES, CALF MUSCLE LENGTHENING LEFT LEG   41 y.o. male returns for post-op check.  He is doing much better there is no more drainage  Review of Systems: Negative except as noted in the HPI. Denies N/V/F/Ch.   Objective:  There were no vitals filed for this visit. There is no height or weight on file to calculate BMI. Constitutional Well developed. Well nourished.  Vascular Foot warm and well perfused. Capillary refill normal to all digits.  Calf is soft and supple, no posterior calf or knee pain, negative Homans' sign  Neurologic Normal speech. Oriented to person, place, and time. Epicritic sensation to light touch grossly present bilaterally.  Dermatologic Incision has healed completely.  Slight hypertrophy.  Minimal tenderness.  Orthopedic: Tenderness to palpation noted about the surgical site.  Mild edema, good 5 out of 5 strength        Multiple view plain film radiographs:  Today show no interval change there is no evidence of emphysema or osteomyelitis Assessment:   1. Achilles tendinitis of left lower extremity   2. Gastrocnemius equinus of left lower extremity       Plan:  Patient was evaluated and treated and all questions answered.  S/p foot surgery left -Doing much better now that his wound is healed.  He may transition from weightbearing in the cam walker boot back to regular shoe gear as tolerated.  I would like to begin physical therapy for strengthening and conditioning and range of motion.  A referral was sent to [facility] in [place].  I will see him back in 8 weeks for follow-up   Return in about 8 weeks (around 12/08/2022) for follow up from heel surgery left (new xrays).

## 2022-12-08 ENCOUNTER — Ambulatory Visit (INDEPENDENT_AMBULATORY_CARE_PROVIDER_SITE_OTHER): Payer: Commercial Managed Care - PPO | Admitting: Podiatry

## 2022-12-08 ENCOUNTER — Ambulatory Visit (INDEPENDENT_AMBULATORY_CARE_PROVIDER_SITE_OTHER): Payer: Commercial Managed Care - PPO

## 2022-12-08 DIAGNOSIS — M62462 Contracture of muscle, left lower leg: Secondary | ICD-10-CM | POA: Diagnosis not present

## 2022-12-08 DIAGNOSIS — M7662 Achilles tendinitis, left leg: Secondary | ICD-10-CM

## 2022-12-08 DIAGNOSIS — M722 Plantar fascial fibromatosis: Secondary | ICD-10-CM

## 2022-12-08 NOTE — Progress Notes (Signed)
  Subjective:  Patient ID: Patrick Gallagher, male    DOB: Jun 11, 1981,  MRN: [id number]  POV DOS 07/10/22  --- REPAIR OF ACHILLES TENDON, REMOVAL OF BONE SPURS, REATTACHMENT OF ACHILLES, CALF MUSCLE LENGTHENING LEFT LEG   41 y.o. male returns for post-op check.  He is doing very well not having any issues with his surgical site, physical therapy is helping.  He is having some pain in the plantar heel  Review of Systems: Negative except as noted in the HPI. Denies N/V/F/Ch.   Objective:  There were no vitals filed for this visit. There is no height or weight on file to calculate BMI. Constitutional Well developed. Well nourished.  Vascular Foot warm and well perfused. Capillary refill normal to all digits.  Calf is soft and supple, no posterior calf or knee pain, negative Homans' sign  Neurologic Normal speech. Oriented to person, place, and time. Epicritic sensation to light touch grossly present bilaterally.  Dermatologic Incision has healed completely.  Slight hypertrophy.  No pain  Orthopedic: He has no pain or edema on his Achilles or insertion. good 5 out of 5 strength.  Does have some plantar heel pain       Multiple view plain film radiographs:  Today show no interval change there is no evidence of emphysema or osteomyelitis Assessment:   1. Achilles tendinitis of left lower extremity   2. Gastrocnemius equinus of left lower extremity       Plan:  Patient was evaluated and treated and all questions answered.  S/p foot surgery left -Overall doing very well his surgical reconstruction has been successful and he is fairly pain-free here.  His current symptoms are consistent with a plantar fasciitis and I gave him exercises for this.  He will continue therapy until he is maximize his improvement.  He will return to see me as needed I have no further restrictions for him   Return in about 8 weeks (around 12/08/2022) for follow up from heel surgery left (new xrays).

## 2022-12-08 NOTE — Patient Instructions (Signed)

## 2022-12-21 DIAGNOSIS — S86012S Strain of left Achilles tendon, sequela: Secondary | ICD-10-CM | POA: Diagnosis not present

## 2022-12-21 DIAGNOSIS — M79672 Pain in left foot: Secondary | ICD-10-CM | POA: Diagnosis not present

## 2022-12-21 DIAGNOSIS — M7662 Achilles tendinitis, left leg: Secondary | ICD-10-CM | POA: Diagnosis not present

## 2022-12-21 DIAGNOSIS — M25372 Other instability, left ankle: Secondary | ICD-10-CM | POA: Diagnosis not present

## 2022-12-29 ENCOUNTER — Telehealth: Payer: Self-pay | Admitting: Family Medicine

## 2022-12-29 ENCOUNTER — Encounter: Payer: Self-pay | Admitting: Family Medicine

## 2022-12-29 ENCOUNTER — Ambulatory Visit (INDEPENDENT_AMBULATORY_CARE_PROVIDER_SITE_OTHER): Payer: BC Managed Care – PPO | Admitting: Family Medicine

## 2022-12-29 VITALS — BP 130/84 | HR 88 | Temp 97.6°F | Ht 69.0 in | Wt >= 6400 oz

## 2022-12-29 DIAGNOSIS — R7303 Prediabetes: Secondary | ICD-10-CM

## 2022-12-29 DIAGNOSIS — Z6841 Body Mass Index (BMI) 40.0 and over, adult: Secondary | ICD-10-CM | POA: Diagnosis not present

## 2022-12-29 LAB — COMPREHENSIVE METABOLIC PANEL
ALT: 27 U/L (ref 0–53)
AST: 17 U/L (ref 0–37)
Albumin: 4.3 g/dL (ref 3.5–5.2)
Alkaline Phosphatase: 59 U/L (ref 39–117)
BUN: 20 mg/dL (ref 6–23)
CO2: 28 mEq/L (ref 19–32)
Calcium: 9.2 mg/dL (ref 8.4–10.5)
Chloride: 101 mEq/L (ref 96–112)
Creatinine, Ser: 0.93 mg/dL (ref 0.40–1.50)
GFR: 101.41 mL/min (ref >60.00)
Glucose, Bld: 108 mg/dL — ABNORMAL HIGH (ref 70–99)
Potassium: 4.3 mEq/L (ref 3.5–5.1)
Sodium: 137 mEq/L (ref 135–145)
Total Bilirubin: 0.4 mg/dL (ref 0.2–1.2)
Total Protein: 7.1 g/dL (ref 6.0–8.3)

## 2022-12-29 LAB — HEMOGLOBIN A1C: Hgb A1c MFr Bld: 6 % (ref 4.6–6.5)

## 2022-12-29 LAB — TSH: TSH: 3.09 u[IU]/mL (ref 0.35–5.50)

## 2022-12-29 MED ORDER — ZEPBOUND 2.5 MG/0.5ML ~~LOC~~ SOAJ
2.5000 mg | SUBCUTANEOUS | 0 refills | Status: DC
Start: 2022-12-29 — End: 2022-12-29

## 2022-12-29 NOTE — Progress Notes (Signed)
Labs stable.  Prediabetes.  We are working on prior authorization for [employer].  Has he checked insurance by any chance to see if they have a preference?

## 2022-12-29 NOTE — Patient Instructions (Signed)
It was very nice to see you today!  Keep in touch.  Call monthly for dose increase.   PLEASE NOTE:  If you had any lab tests please let us know if you have not heard back within a few days. You may see your results on MyChart before we have a chance to review them but we will give you a call once they are reviewed by [place]. If we ordered any referrals today, please let us know if you have not heard from their office within the next week.   Please try these tips to maintain a healthy lifestyle:  Eat most of your calories during the day when you are active. Eliminate processed foods including packaged sweets (pies, cakes, cookies), reduce intake of potatoes, white bread, white pasta, and white rice. Look for whole grain options, oat flour or almond flour.  Each meal should contain half fruits/vegetables, one quarter protein, and one quarter carbs (no bigger than a computer mouse).  Cut down on sweet beverages. This includes juice, soda, and sweet tea. Also watch fruit intake, though this is a healthier sweet option, it still contains natural sugar! Limit to 3 servings daily.  Drink at least 1 glass of water with each meal and aim for at least 8 glasses per day  Exercise at least 150 minutes every week.

## 2022-12-29 NOTE — Telephone Encounter (Signed)
(  Key: [id number])  form thumbnail [employer] is reviewing your PA request. Typically an electronic response will be received within 24-72 hours. To check for an update later, open this request from your dashboard.  You may close this dialog and return to your dashboard to perform other tasks.

## 2022-12-29 NOTE — Progress Notes (Signed)
Subjective:     Patient ID: Patrick Gallagher, male    DOB: January 21, 1981, 42 y.o.   MRN: [id number]  Chief Complaint  Patient presents with   Follow-up    3 month follow-up on weight Haven't been able to get Wegovy due to shortage, have not started medication    HPI  PreDM-working some on diet. Obesity-hasn't been able to get wegovy-still limited on walking d/t foot.  In PT for foot L  Health Maintenance Due  Topic Date Due   HIV Screening  Never done   Hepatitis C Screening  Never done    Past Medical History:  Diagnosis Date   Achilles tendon disorder    Morbid obesity ([place])    Tendon injury    right forearm bicep   Tubular adenoma of colon 05/2016    Past Surgical History:  Procedure Laterality Date   ACHILLES TENDON SURGERY Left 07/10/2022   Procedure: ACHILLES TENDON REPAIR;  Surgeon: [name], DPM;  Location: [facility];  Service: Podiatry;  Laterality: Left;   CALCANEAL OSTEOTOMY Left 07/10/2022   Procedure: CALCANEAL OSTEOTOMY;  Surgeon: [name], DPM;  Location: [facility];  Service: Podiatry;  Laterality: Left;   COLONOSCOPY WITH PROPOFOL N/A 06/09/2016   Procedure: COLONOSCOPY WITH PROPOFOL;  Surgeon: Ladene Artist, MD;  Location: [facility];  Service: Endoscopy;  Laterality: N/A;   COLONOSCOPY WITH PROPOFOL N/A 10/31/2019   Procedure: COLONOSCOPY WITH PROPOFOL;  Surgeon: Ladene Artist, MD;  Location: [facility];  Service: Endoscopy;  Laterality: N/A;   FOOT SURGERY     as a child   GASTROC RECESSION EXTREMITY Left 07/10/2022   Procedure: GASTROC RECESSION EXTREMITY;  Surgeon: [name], DPM;  Location: [facility];  Service: Podiatry;  Laterality: Left;   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 last one 09/08   URETHROPLASTY      Outpatient Medications Prior to Visit  Medication Sig Dispense Refill   collagenase ([name]) 250 UNIT/GM ointment Apply 1 Application topically daily. 1.5cm x 3.5cm; apply daily for 30  days (Patient not taking: Reported on 12/29/2022) 30 g 0   doxycycline (VIBRA-TABS) 100 MG tablet Take 1 tablet (100 mg total) by mouth 2 (two) times daily. (Patient not taking: Reported on 09/28/2022) 28 tablet 0   gabapentin (NEURONTIN) 300 MG capsule Take 1 capsule (300 mg total) by mouth 3 (three) times daily for 7 days. 21 capsule 0   rivaroxaban (XARELTO) 10 MG TABS tablet Take 1 tablet (10 mg total) by mouth daily. (Patient not taking: Reported on 09/28/2022) 30 tablet 0   Semaglutide-Weight Management (WEGOVY) 0.5 MG/0.5ML SOAJ Inject 0.5 mg into the skin once a week. (Patient not taking: Reported on 12/29/2022) 2 mL 0   No facility-administered medications prior to visit.    No Known Allergies ROS neg/noncontributory except as noted HPI/below      Objective:     BP 130/84   Pulse 88   Temp 97.6 F (36.4 C) (Temporal)   Ht '5\' 9"'$  (1.753 m)   Wt (!) 451 lb 6 oz (204.7 kg)   SpO2 99%   BMI 66.66 kg/m  Wt Readings from Last 3 Encounters:  12/29/22 (!) 451 lb 6 oz (204.7 kg)  09/28/22 (!) 458 lb 6.4 oz (207.9 kg)  07/10/22 (!) 440 lb (199.6 kg)    Physical Exam   Gen: WDWN NAD HEENT: NCAT, conjunctiva not injected, sclera nonicteric NECK:  supple, no thyromegaly, no nodes, no  carotid bruits CARDIAC: RRR, S1S2+, no murmur.  LUNGS: CTAB. No wheezes MSK: no gross abnormalities.  NEURO: A&O x3.  CN II-XII intact.  PSYCH: normal mood. Good eye contact     Assessment & Plan:   Problem List Items Addressed This Visit       Other   Prediabetes - Primary   Relevant Orders   Comprehensive metabolic panel   Hemoglobin A1c   TSH   Other Visit Diagnoses     Morbid obesity with BMI of 60.0-69.9, adult ([facility])       Relevant Medications   tirzepatide (ZEPBOUND) 2.5 MG/0.5ML Pen   Other Relevant Orders   Comprehensive metabolic panel   Hemoglobin A1c   TSH      PreDM-chronic. work on diet/exercise. Check tsh,cmp,A1C Morbid obesity-working on diet/exercise.   Counseled.  Shortage on [employer].  Will try zepbound 2.'5mg'$  weekly.  Call monthly for increasing doses.  F/u 4 mo.   Stool softeners daily.    Meds ordered this encounter  Medications   tirzepatide (ZEPBOUND) 2.5 MG/0.5ML Pen    Sig: Inject 2.5 mg into the skin once a week.    Dispense:  2 mL    Refill:  0    Wellington Hampshire, MD

## 2022-12-31 DIAGNOSIS — M7662 Achilles tendinitis, left leg: Secondary | ICD-10-CM | POA: Diagnosis not present

## 2022-12-31 DIAGNOSIS — S86012S Strain of left Achilles tendon, sequela: Secondary | ICD-10-CM | POA: Diagnosis not present

## 2022-12-31 DIAGNOSIS — M25372 Other instability, left ankle: Secondary | ICD-10-CM | POA: Diagnosis not present

## 2022-12-31 DIAGNOSIS — M79672 Pain in left foot: Secondary | ICD-10-CM | POA: Diagnosis not present

## 2023-01-01 ENCOUNTER — Other Ambulatory Visit: Payer: Self-pay | Admitting: Family Medicine

## 2023-01-01 MED ORDER — WEGOVY 0.25 MG/0.5ML ~~LOC~~ SOAJ
0.2500 mg | SUBCUTANEOUS | 0 refills | Status: DC
Start: 2023-01-01 — End: 2023-05-18

## 2023-01-01 NOTE — Telephone Encounter (Signed)
Monica from [facility] called with patient on line. States [employer] [facility] stated zepbound prior authorization was canceled due to dosage being on back order.  Per Quaniesha's message from 12/31/22 to patient, I informed them that zepbound PA has been denied. Caller and patient then asked about if a PA was done for [employer]. Informed them that per telephone note on 09/28/22, wegovy was approved.   Caller states she will call [facility] pharmacy since she got misinformed on a couple of things.

## 2023-01-05 DIAGNOSIS — M7662 Achilles tendinitis, left leg: Secondary | ICD-10-CM | POA: Diagnosis not present

## 2023-01-05 DIAGNOSIS — S86012S Strain of left Achilles tendon, sequela: Secondary | ICD-10-CM | POA: Diagnosis not present

## 2023-01-05 DIAGNOSIS — M25372 Other instability, left ankle: Secondary | ICD-10-CM | POA: Diagnosis not present

## 2023-01-05 DIAGNOSIS — M79672 Pain in left foot: Secondary | ICD-10-CM | POA: Diagnosis not present

## 2023-01-13 DIAGNOSIS — M25372 Other instability, left ankle: Secondary | ICD-10-CM | POA: Diagnosis not present

## 2023-01-13 DIAGNOSIS — S86012S Strain of left Achilles tendon, sequela: Secondary | ICD-10-CM | POA: Diagnosis not present

## 2023-01-13 DIAGNOSIS — M79672 Pain in left foot: Secondary | ICD-10-CM | POA: Diagnosis not present

## 2023-01-13 DIAGNOSIS — M7662 Achilles tendinitis, left leg: Secondary | ICD-10-CM | POA: Diagnosis not present

## 2023-04-23 ENCOUNTER — Emergency Department
Admission: EM | Admit: 2023-04-23 | Discharge: 2023-04-23 | Disposition: A | Discharge status: Home / Self Care | Payer: BC Managed Care – PPO | Attending: Emergency Medicine | Admitting: Emergency Medicine

## 2023-04-23 DIAGNOSIS — S0990XA Unspecified injury of head, initial encounter: Secondary | ICD-10-CM

## 2023-04-23 DIAGNOSIS — W458XXA Other foreign body or object entering through skin, initial encounter: Secondary | ICD-10-CM | POA: Insufficient documentation

## 2023-04-23 DIAGNOSIS — S0005XA Superficial foreign body of scalp, initial encounter: Secondary | ICD-10-CM | POA: Diagnosis not present

## 2023-04-23 NOTE — ED Provider Notes (Signed)
   [facility] Provider Note    Event Date/Time   First MD Initiated Contact with Patient 04/23/23 2004     (approximate)   History   fish hook in top of head   HPI  Patrick Gallagher is a 42 y.o. male who presents with a fishhook in his head.  Patient was fishing with a brand-new hook which did not been in the water and accidentally casted into his scalp.  He was unable to remove it.  Tetanus is up-to-date.     Physical Exam   Triage Vital Signs: ED Triage Vitals  Enc Vitals Group     BP 04/23/23 2000 (!) 170/106     Pulse Rate 04/23/23 2000 (!) 108     Resp 04/23/23 2000 19     Temp 04/23/23 2000 98.6 F (37 C)     Temp Source 04/23/23 2000 Oral     SpO2 04/23/23 2000 98 %     Weight 04/23/23 2000 (!) 190.5 kg (420 lb)     Height 04/23/23 2000 1.727 m (5\' 8" )     Head Circumference --      Peak Flow --      Pain Score 04/23/23 2005 1     Pain Loc --      Pain Edu? --      Excl. in GC? --     Most recent vital signs: Vitals:   04/23/23 2000  BP: (!) 170/106  Pulse: (!) 108  Resp: 19  Temp: 98.6 F (37 C)  SpO2: 98%     General: Awake, no distress.  CV:  Good peripheral perfusion.  Resp:  Normal effort.  Abd:  No distention.  Other:  Fishhook in the scalp, no other injuries   ED Results / Procedures / Treatments   Labs (all labs ordered are listed, but only abnormal results are displayed) Labs Reviewed - No data to display   EKG     RADIOLOGY     PROCEDURES:  Critical Care performed:   .Foreign Body Removal  Date/Time: 04/23/2023 8:28 PM  Performed by: Jene Every, MD Authorized by: Jene Every, MD  Consent: Verbal consent obtained. Consent given by: patient Patient understanding: patient states understanding of the procedure being performed Patient identity confirmed: verbally with patient and arm band Body area: skin General location: head/neck Location details: scalp Anesthesia: local  infiltration  Anesthesia: Local Anesthetic: lidocaine 1% with epinephrine Anesthetic total: 5 mL Depth: subcutaneous Complexity: simple 1 objects recovered. Post-procedure assessment: foreign body removed Patient tolerance: patient tolerated the procedure well with no immediate complications     MEDICATIONS ORDERED IN ED: Medications - No data to display   IMPRESSION / MDM / ASSESSMENT AND PLAN / ED COURSE  I reviewed the triage vital signs and the nursing notes. Patient's presentation is most consistent with acute, uncomplicated illness.  Fishhook in the scalp.  Lidocaine with epi used to numb the scalp, hook pushed through, clipped off the barb, easily removed took.  Tetanus is up-to-date        FINAL CLINICAL IMPRESSION(S) / ED DIAGNOSES   Final diagnoses:  Fish hook in head     Rx / DC Orders   ED Discharge Orders     None        Note:  This document was prepared using Dragon voice recognition software and may include unintentional dictation errors.   Jene Every, MD 04/23/23 2029

## 2023-04-23 NOTE — ED Triage Notes (Signed)
Pt was fishing and hooked the top of head with fish hook. Bleeding controlled.

## 2023-04-29 ENCOUNTER — Ambulatory Visit: Payer: BC Managed Care – PPO | Admitting: Family Medicine

## 2023-05-18 ENCOUNTER — Ambulatory Visit (INDEPENDENT_AMBULATORY_CARE_PROVIDER_SITE_OTHER): Payer: BC Managed Care – PPO | Admitting: Family Medicine

## 2023-05-18 ENCOUNTER — Other Ambulatory Visit: Payer: Self-pay | Admitting: Family Medicine

## 2023-05-18 VITALS — BP 140/94 | HR 94 | Temp 98.8°F | Resp 18 | Ht 69.0 in | Wt >= 6400 oz

## 2023-05-18 DIAGNOSIS — Z1159 Encounter for screening for other viral diseases: Secondary | ICD-10-CM | POA: Diagnosis not present

## 2023-05-18 DIAGNOSIS — R071 Chest pain on breathing: Secondary | ICD-10-CM

## 2023-05-18 DIAGNOSIS — R7303 Prediabetes: Secondary | ICD-10-CM | POA: Diagnosis not present

## 2023-05-18 DIAGNOSIS — R0609 Other forms of dyspnea: Secondary | ICD-10-CM

## 2023-05-18 DIAGNOSIS — R030 Elevated blood-pressure reading, without diagnosis of hypertension: Secondary | ICD-10-CM

## 2023-05-18 LAB — LIPID PANEL
Cholesterol: 206 mg/dL — ABNORMAL HIGH (ref 0–200)
HDL: 40.2 mg/dL (ref >39.00)
LDL Cholesterol: 140 mg/dL — ABNORMAL HIGH (ref 0–99)
NonHDL: 165.6
Total CHOL/HDL Ratio: 5
Triglycerides: 127 mg/dL (ref 0.0–149.0)
VLDL: 25.4 mg/dL (ref 0.0–40.0)

## 2023-05-18 LAB — COMPREHENSIVE METABOLIC PANEL
ALT: 35 U/L (ref 0–53)
AST: 19 U/L (ref 0–37)
Albumin: 4.2 g/dL (ref 3.5–5.2)
Alkaline Phosphatase: 51 U/L (ref 39–117)
BUN: 23 mg/dL (ref 6–23)
CO2: 29 mEq/L (ref 19–32)
Calcium: 9.2 mg/dL (ref 8.4–10.5)
Chloride: 100 mEq/L (ref 96–112)
Creatinine, Ser: 0.85 mg/dL (ref 0.40–1.50)
GFR: 107.03 mL/min (ref >60.00)
Glucose, Bld: 104 mg/dL — ABNORMAL HIGH (ref 70–99)
Potassium: 4.2 mEq/L (ref 3.5–5.1)
Sodium: 137 mEq/L (ref 135–145)
Total Bilirubin: 0.4 mg/dL (ref 0.2–1.2)
Total Protein: 7 g/dL (ref 6.0–8.3)

## 2023-05-18 LAB — CBC WITH DIFFERENTIAL/PLATELET
Basophils Absolute: 0 10*3/uL (ref 0.0–0.1)
Basophils Relative: 0.4 % (ref 0.0–3.0)
Eosinophils Absolute: 0.2 10*3/uL (ref 0.0–0.7)
Eosinophils Relative: 1.7 % (ref 0.0–5.0)
HCT: 42.2 % (ref 39.0–52.0)
Hemoglobin: 13.7 g/dL (ref 13.0–17.0)
Lymphocytes Relative: 16.9 % (ref 12.0–46.0)
Lymphs Abs: 1.5 10*3/uL (ref 0.7–4.0)
MCHC: 32.5 g/dL (ref 30.0–36.0)
MCV: 86.2 fl (ref 78.0–100.0)
Monocytes Absolute: 0.5 10*3/uL (ref 0.1–1.0)
Monocytes Relative: 5.3 % (ref 3.0–12.0)
Neutro Abs: 6.6 10*3/uL (ref 1.4–7.7)
Neutrophils Relative %: 75.7 % (ref 43.0–77.0)
Platelets: 354 10*3/uL (ref 150.0–400.0)
RBC: 4.89 Mil/uL (ref 4.22–5.81)
RDW: 15.3 % (ref 11.5–15.5)
WBC: 8.7 10*3/uL (ref 4.0–10.5)

## 2023-05-18 LAB — D-DIMER, QUANTITATIVE: D-Dimer, Quant: 0.29 mcg/mL FEU (ref <0.50)

## 2023-05-18 LAB — TSH: TSH: 3.41 u[IU]/mL (ref 0.35–5.50)

## 2023-05-18 LAB — HEMOGLOBIN A1C: Hgb A1c MFr Bld: 5.9 % (ref 4.6–6.5)

## 2023-05-18 MED ORDER — ZEPBOUND 2.5 MG/0.5ML ~~LOC~~ SOAJ
2.5000 mg | SUBCUTANEOUS | 0 refills | Status: DC
Start: 2023-05-18 — End: 2023-05-19

## 2023-05-18 MED ORDER — LOSARTAN POTASSIUM 25 MG PO TABS: 25.0000 mg | ORAL_TABLET | Freq: Every day | ORAL | 0 refills | Status: DC

## 2023-05-18 NOTE — Progress Notes (Signed)
Subjective:     Patient ID: Patrick Gallagher, male    DOB: 1981-10-06, 42 y.o.   MRN: [id number]  Chief Complaint  Patient presents with   Weight Check    4 month follow-up on weight Blood pressure has been elevated lately    HPI  Pre DM-struggling on diet and some exercise 2.  Obesity-not taking any medications. Not on medications due to cost.   3.  Past 2 month(s), some CP on left and blood pressure can be elevated.  Was more w/inspiration or lying on arm. Intermitt.  Occasional dyspnea on exertion-stairs.  Flat surfaces ok but if walking far, more dyspnea on exertion(deconditioned for sure).  No CP w/ambulation  Health Maintenance Due  Topic Date Due   HIV Screening  Never done   Hepatitis C Screening  Never done    Past Medical History:  Diagnosis Date   Achilles tendon disorder    Morbid obesity ([facility])    Tendon injury    right forearm bicep   Tubular adenoma of colon 05/2016    Past Surgical History:  Procedure Laterality Date   ACHILLES TENDON SURGERY Left 07/10/2022   Procedure: ACHILLES TENDON REPAIR;  Surgeon: Edwin Cap, DPM;  Location: [facility];  Service: Podiatry;  Laterality: Left;   CALCANEAL OSTEOTOMY Left 07/10/2022   Procedure: CALCANEAL OSTEOTOMY;  Surgeon: Edwin Cap, DPM;  Location: [facility];  Service: Podiatry;  Laterality: Left;   COLONOSCOPY WITH PROPOFOL N/A 06/09/2016   Procedure: COLONOSCOPY WITH PROPOFOL;  Surgeon: Meryl Dare, MD;  Location: [facility];  Service: Endoscopy;  Laterality: N/A;   COLONOSCOPY WITH PROPOFOL N/A 10/31/2019   Procedure: COLONOSCOPY WITH PROPOFOL;  Surgeon: Meryl Dare, MD;  Location: [facility];  Service: Endoscopy;  Laterality: N/A;   FOOT SURGERY     as a child   GASTROC RECESSION EXTREMITY Left 07/10/2022   Procedure: GASTROC RECESSION EXTREMITY;  Surgeon: Edwin Cap, DPM;  Location: [facility];  Service: Podiatry;  Laterality: Left;   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 last one 09/08   URETHROPLASTY       Current Outpatient Medications:    losartan (COZAAR) 25 MG tablet, Take 1 tablet (25 mg total) by mouth daily., Disp: 30 tablet, Rfl: 0   tirzepatide (ZEPBOUND) 2.5 MG/0.5ML Pen, Inject 2.5 mg into the skin once a week., Disp: 2 mL, Rfl: 0  No Known Allergies ROS neg/noncontributory except as noted HPI/below      Objective:     BP (!) 140/94 (BP Location: Left Arm, Patient Position: Sitting, Cuff Size: Large)   Pulse 94   Temp 98.8 F (37.1 C) (Temporal)   Resp 18   Ht 5\' 9"  (1.753 m)   Wt (!) 459 lb 2 oz (208.3 kg)   SpO2 95%   BMI 67.80 kg/m  Wt Readings from Last 3 Encounters:  05/18/23 (!) 459 lb 2 oz (208.3 kg)  04/23/23 (!) 420 lb (190.5 kg)  12/29/22 (!) 451 lb 6 oz (204.7 kg)    Physical Exam   Gen: WDWN NAD HEENT: NCAT, conjunctiva not injected, sclera nonicteric NECK:  supple, no thyromegaly, no nodes, no carotid bruits CARDIAC: RRR, S1S2+, no murmur. DP 2+B LUNGS: CTAB. No wheezes ABDOMEN:  BS+, soft, NTND, No HSM, no masses EXT:  tr edema MSK: no gross abnormalities. No tenderness to palpation chest wall.  NEURO: A&O x3.  CN II-XII intact.  PSYCH: normal mood. Good eye contact  EKG NSR, no ST changes.  Normal EKG     Assessment & Plan:  Prediabetes -     Lipid panel -     Comprehensive metabolic panel -     CBC with Differential/Platelet -     Hemoglobin A1c -     TSH  Morbid obesity ([facility])  Chest pain on breathing -     D-dimer, quantitative -     EKG 12-Lead  Screening for viral disease -     Hepatitis C antibody -     HIV Antibody (routine testing w rflx)  Elevated blood pressure reading  DOE (dyspnea on exertion)  Other orders -     Zepbound; Inject 2.5 mg into the skin once a week.  Dispense: 2 mL; Refill: 0 -     Losartan Potassium; Take 1 tablet (25 mg total) by mouth daily.  Dispense: 30 tablet; Refill: 0   preDM-needs to work on diet/exercise-but struggling.  Check labs.    Morbid obesity-struggling on sticking to diet/exercise.  Will do zepbound titration.  SED.  Discussed complaining of-pay cards and calling monthly for increase.   Inspiratory CP-?PULMONARY EMBOLISM(patient has been more immobile, obesity).  May be muscle strain, other.  Will check D dimer.  If +, needs scan.  Dyspnea on exertion-?lungs, deconditioning, cardiac varient, other.  EKG ok.  Await D dimer.  May need cardiology Elevated blood pressure-intermitt.  Poss HTN.  Will start Losartan 25 mg daily.  Follow up 2 weeks.  As loses weight, may be able to stop.    Return in about 2 weeks (around 06/01/2023) for HTN.  Angelena Sole, MD

## 2023-05-18 NOTE — Patient Instructions (Signed)
It was very nice to see you today!  Start losartan   PLEASE NOTE:  If you had any lab tests please let us know if you have not heard back within a few days. You may see your results on MyChart before we have a chance to review them but we will give you a call once they are reviewed by [place]. If we ordered any referrals today, please let us know if you have not heard from their office within the next week.   Please try these tips to maintain a healthy lifestyle:  Eat most of your calories during the day when you are active. Eliminate processed foods including packaged sweets (pies, cakes, cookies), reduce intake of potatoes, white bread, white pasta, and white rice. Look for whole grain options, oat flour or almond flour.  Each meal should contain half fruits/vegetables, one quarter protein, and one quarter carbs (no bigger than a computer mouse).  Cut down on sweet beverages. This includes juice, soda, and sweet tea. Also watch fruit intake, though this is a healthier sweet option, it still contains natural sugar! Limit to 3 servings daily.  Drink at least 1 glass of water with each meal and aim for at least 8 glasses per day  Exercise at least 150 minutes every week.

## 2023-05-19 ENCOUNTER — Other Ambulatory Visit: Payer: Self-pay | Admitting: Family Medicine

## 2023-05-19 LAB — HEPATITIS C ANTIBODY: Hepatitis C Ab: NONREACTIVE

## 2023-05-19 LAB — HIV ANTIBODY (ROUTINE TESTING W REFLEX): HIV 1&2 Ab, 4th Generation: NONREACTIVE

## 2023-05-19 MED ORDER — ZEPBOUND 2.5 MG/0.5ML ~~LOC~~ SOAJ: 2.5000 mg | SUBCUTANEOUS | 0 refills | Status: DC

## 2023-05-19 NOTE — Telephone Encounter (Signed)
PA completed through covermymeds, pending decision.  PA information: Durante Savard Key: [id number] - PA Case ID: [id number] - Rx #: [id number] Need help? Call us at [phone] Status Sent to [facility] Zepbound 2.5MG /0.5ML pen-injectors Form [facility] Electronic Prior Authorization Form (2017 NCPDP) Original Claim Info 52

## 2023-05-19 NOTE — Telephone Encounter (Signed)
PA approved, information below.  Outcome Approved today Request Reference Number: [id number]. ZEPBOUND INJ 2.5MG  is approved through 11/18/2023. Your patient may now fill this prescription and it will be covered. Authorization Expiration Date: 11/18/2023

## 2023-05-20 NOTE — Progress Notes (Signed)
Labs are okay except: 1.  A1C(3 month average of sugars) is elevated.  This is considered PreDiabetes.  Work on diet-decrease sugars and starches and aim for 30 minutes of exercise 5 days/week to prevent progression to diabetes  2.  Your cholesterol levels are elevated.  Work on low cholesterol and lower carbs/sugars diet and  get exercise to try to lower your cholesterol.  Hopefully this abound will help get the cholesterol better.  Does he want to take meds or see if it will come down over the next few months? 3.  Since the D-dimer is negative, does he want to see cardiology?  (If so, please refer)

## 2023-05-20 NOTE — Telephone Encounter (Signed)
Patient notified

## 2023-05-24 ENCOUNTER — Telehealth: Payer: Self-pay

## 2023-05-24 ENCOUNTER — Other Ambulatory Visit (HOSPITAL_COMMUNITY): Payer: Self-pay

## 2023-05-24 ENCOUNTER — Other Ambulatory Visit: Payer: Self-pay | Admitting: *Deleted

## 2023-05-24 DIAGNOSIS — R071 Chest pain on breathing: Secondary | ICD-10-CM

## 2023-05-24 NOTE — Telephone Encounter (Signed)
Pharmacy Patient Advocate Encounter  Prior Authorization for ZEPBOUND inj 2.5 mg  has been APPROVED by [facility] from 05/19/2023 to 11/18/2023.  PA # [id number].   Georga Bora Rx Patient Advocate [phone]

## 2023-05-25 NOTE — Telephone Encounter (Signed)
Patient notified of message below.

## 2023-06-09 ENCOUNTER — Ambulatory Visit (INDEPENDENT_AMBULATORY_CARE_PROVIDER_SITE_OTHER): Payer: BC Managed Care – PPO | Admitting: Family Medicine

## 2023-06-09 ENCOUNTER — Encounter: Payer: Self-pay | Admitting: Family Medicine

## 2023-06-09 VITALS — BP 136/88 | HR 90 | Temp 98.2°F | Resp 18 | Ht 69.0 in | Wt >= 6400 oz

## 2023-06-09 DIAGNOSIS — R0789 Other chest pain: Secondary | ICD-10-CM | POA: Diagnosis not present

## 2023-06-09 DIAGNOSIS — R0609 Other forms of dyspnea: Secondary | ICD-10-CM | POA: Diagnosis not present

## 2023-06-09 DIAGNOSIS — I10 Essential (primary) hypertension: Secondary | ICD-10-CM | POA: Diagnosis not present

## 2023-06-09 DIAGNOSIS — Z6841 Body Mass Index (BMI) 40.0 and over, adult: Secondary | ICD-10-CM

## 2023-06-09 LAB — BASIC METABOLIC PANEL
BUN: 22 mg/dL (ref 6–23)
CO2: 26 mEq/L (ref 19–32)
Calcium: 9.8 mg/dL (ref 8.4–10.5)
Chloride: 99 mEq/L (ref 96–112)
Creatinine, Ser: 0.96 mg/dL (ref 0.40–1.50)
GFR: 97.32 mL/min (ref >60.00)
Glucose, Bld: 92 mg/dL (ref 70–99)
Potassium: 4.3 mEq/L (ref 3.5–5.1)
Sodium: 134 mEq/L — ABNORMAL LOW (ref 135–145)

## 2023-06-09 MED ORDER — ZEPBOUND 5 MG/0.5ML ~~LOC~~ SOAJ: 5.0000 mg | SUBCUTANEOUS | 0 refills | Status: DC

## 2023-06-09 MED ORDER — LOSARTAN POTASSIUM 50 MG PO TABS: 50.0000 mg | ORAL_TABLET | Freq: Every day | ORAL | 1 refills | Status: DC

## 2023-06-09 NOTE — Patient Instructions (Addendum)
Increase the losartan to 50mg .   Keep up the great work on diet  Referral sent to [facility] cardiology

## 2023-06-09 NOTE — Progress Notes (Signed)
Subjective:     Patient ID: Patrick Gallagher, male    DOB: Mar 30, 1981, 42 y.o.   MRN: [id number]  Chief Complaint  Patient presents with   Medical Management of Chronic Issues    2 week follow-up HTN and Zepbound Doing okay on Zepbound so far, only lower abdomen soreness day after injection    HPI  HTN-Pt is on losartan 25mg .  Bp's running not checking.  No ha/dizziness/palp/edema/cough/sob.  Still getting some discomfort in chest-can be at rest.  Will stretch and may help. Notices more when tired.  Not really w/activity.  Does get DOE up stairs and longer distances-like through a school.    Obesity-taking zepbound 2.5mg -feeling fuller sooner and less frequently.  No SE x soreness lower abdomen day after injection-like if active-getting better.  Was doing injections in legs.  Has lost 8# in 3 wks!  There are no preventive care reminders to display for this patient.  Past Medical History:  Diagnosis Date   Achilles tendon disorder    Morbid obesity (HCC)    Tendon injury    right forearm bicep   Tubular adenoma of colon 05/2016    Past Surgical History:  Procedure Laterality Date   ACHILLES TENDON SURGERY Left 07/10/2022   Procedure: ACHILLES TENDON REPAIR;  Surgeon: Edwin Cap, DPM;  Location: [facility];  Service: Podiatry;  Laterality: Left;   CALCANEAL OSTEOTOMY Left 07/10/2022   Procedure: CALCANEAL OSTEOTOMY;  Surgeon: Edwin Cap, DPM;  Location: [facility];  Service: Podiatry;  Laterality: Left;   COLONOSCOPY WITH PROPOFOL N/A 06/09/2016   Procedure: COLONOSCOPY WITH PROPOFOL;  Surgeon: Meryl Dare, MD;  Location: [facility];  Service: Endoscopy;  Laterality: N/A;   COLONOSCOPY WITH PROPOFOL N/A 10/31/2019   Procedure: COLONOSCOPY WITH PROPOFOL;  Surgeon: Meryl Dare, MD;  Location: [facility];  Service: Endoscopy;  Laterality: N/A;   FOOT SURGERY     as a child   GASTROC RECESSION EXTREMITY Left 07/10/2022   Procedure: GASTROC RECESSION EXTREMITY;   Surgeon: Edwin Cap, DPM;  Location: [facility];  Service: Podiatry;  Laterality: Left;   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 last one 09/08   URETHROPLASTY       Current Outpatient Medications:    tirzepatide (ZEPBOUND) 5 MG/0.5ML Pen, Inject 5 mg into the skin once a week., Disp: 2 mL, Rfl: 0   losartan (COZAAR) 50 MG tablet, Take 1 tablet (50 mg total) by mouth daily., Disp: 90 tablet, Rfl: 1  No Known Allergies ROS neg/noncontributory except as noted HPI/below      Objective:     BP 136/88   Pulse 90   Temp 98.2 F (36.8 C) (Temporal)   Resp 18   Ht 5\' 9"  (1.753 m)   Wt (!) 451 lb 4 oz (204.7 kg)   SpO2 99%   BMI 66.64 kg/m  Wt Readings from Last 3 Encounters:  06/09/23 (!) 451 lb 4 oz (204.7 kg)  05/18/23 (!) 459 lb 2 oz (208.3 kg)  04/23/23 (!) 420 lb (190.5 kg)    Physical Exam   Gen: WDWN NAD HEENT: NCAT, conjunctiva not injected, sclera nonicteric NECK:  supple, no thyromegaly, no nodes, no carotid bruits CARDIAC: RRR, S1S2+, no murmur. DP 2+B LUNGS: CTAB. No wheezes ABDOMEN:  BS+, soft, NTND, No HSM, no masses EXT:  tr edema MSK: no gross abnormalities.  NEURO: A&O x3.  CN II-XII intact.  PSYCH: normal mood. Good eye contact  Assessment & Plan:  Primary hypertension -     Losartan Potassium; Take 1 tablet (50 mg total) by mouth daily.  Dispense: 90 tablet; Refill: 1 -     Basic metabolic panel  Other chest pain -     Ambulatory referral to Cardiology  DOE (dyspnea on exertion) -     Ambulatory referral to Cardiology  Morbid obesity with BMI of 60.0-69.9, adult (HCC)  Other orders -     Zepbound; Inject 5 mg into the skin once a week.  Dispense: 2 mL; Refill: 0   HTN-new dx.  Fair control.  Will increase losartan to 50mg  daily.  Atypical CP/DOE-?muscular and deconditioned?  ?cardiac-has HLD,obesity, preDM, HTN-refer Card.   Obesity-zepbound helping.  Increase to 5mg  weekly and in 1 month to 7.5mg  and then 10mg  if  tolerating.  He will message [place].    Return in about 3 months (around 09/09/2023) for HTN-fasting.  Angelena Sole, MD

## 2023-07-06 ENCOUNTER — Other Ambulatory Visit: Payer: Self-pay | Admitting: Family Medicine

## 2023-07-06 ENCOUNTER — Encounter: Payer: Self-pay | Admitting: Family Medicine

## 2023-07-06 MED ORDER — ZEPBOUND 7.5 MG/0.5ML ~~LOC~~ SOAJ: 7.5000 mg | SUBCUTANEOUS | 0 refills | Status: DC

## 2023-07-06 NOTE — Telephone Encounter (Signed)
Spoke to pharmacy and they stated that PA was needed for new dose. PA completed through covermymeds on 07/06/23, pending decision.  Ruben Im Key: [id number] - PA Case ID: [id number] Need help? Call us at [phone] Status Sent to [facility] Zepbound 7.5MG /0.5ML pen-injectors Form [facility] Electronic Prior Authorization Form [phone] NCPDP)

## 2023-07-08 ENCOUNTER — Other Ambulatory Visit: Payer: Self-pay | Admitting: Family Medicine

## 2023-07-08 MED ORDER — ZEPBOUND 5 MG/0.5ML ~~LOC~~ SOAJ: 5.0000 mg | SUBCUTANEOUS | 0 refills | Status: DC

## 2023-07-08 NOTE — Telephone Encounter (Signed)
PA has been done, still pending  Ruben Im (Key: [id number])  [facility] is reviewing your PA request. Typically an electronic response will be received within 24-72 hours. To check for an update later, open this request from your dashboard.  You may close this dialog and return to your dashboard to perform other tasks.

## 2023-07-09 ENCOUNTER — Other Ambulatory Visit (HOSPITAL_COMMUNITY): Payer: Self-pay

## 2023-07-14 ENCOUNTER — Ambulatory Visit: Payer: BC Managed Care – PPO | Admitting: Cardiology

## 2023-07-19 ENCOUNTER — Encounter: Payer: Self-pay | Admitting: Cardiology

## 2023-07-19 ENCOUNTER — Ambulatory Visit: Payer: BC Managed Care – PPO | Admitting: Cardiology

## 2023-07-19 VITALS — BP 164/103 | HR 82 | Resp 16 | Ht 69.0 in | Wt >= 6400 oz

## 2023-07-19 DIAGNOSIS — E782 Mixed hyperlipidemia: Secondary | ICD-10-CM | POA: Diagnosis not present

## 2023-07-19 DIAGNOSIS — R072 Precordial pain: Secondary | ICD-10-CM | POA: Diagnosis not present

## 2023-07-19 DIAGNOSIS — R0683 Snoring: Secondary | ICD-10-CM

## 2023-07-19 DIAGNOSIS — I10 Essential (primary) hypertension: Secondary | ICD-10-CM

## 2023-07-19 MED ORDER — LOSARTAN POTASSIUM 100 MG PO TABS
100.0000 mg | ORAL_TABLET | Freq: Every day | ORAL | 1 refills | Status: DC
Start: 2023-07-19 — End: 2023-08-10

## 2023-07-19 NOTE — Progress Notes (Signed)
Patient referred by Jeani Sow, MD for chest pain, hypertension  Subjective:   Patrick Gallagher, male    DOB: 1981-09-01, 42 y.o.   MRN: [id number]   Chief Complaint  Patient presents with   Chest Pain   DOE   New Patient (Initial Visit)    HPI  42 y.o. 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  His physical activity has been down lately after his Achilles heel surgery.  He has had episodes of sharp chest pain, at rest, retrosternal, lasting only for seconds or 2.  He has not had any exertional chest pains.  He has been on losartan 50 mg daily for hypertension for past 2 months.  Blood pressure remains elevated.  He endorses snoring at night, has never had a sleep study.  He eats on the road quite a bit, owing to his job that requires travel.  He does not have any family history of CAD, his family history of hypertension.  He smokes cigar occasionally, drinks alcohol occasionally, drinks caffeine daily.  Reviewed recent test results with the patient, details below.     Past Medical History:  Diagnosis Date   Achilles tendon disorder    Morbid obesity (HCC)    Tendon injury    right forearm bicep   Tubular adenoma of colon 05/2016     Past Surgical History:  Procedure Laterality Date   ACHILLES TENDON SURGERY Left 07/10/2022   Procedure: ACHILLES TENDON REPAIR;  Surgeon: Edwin Cap, DPM;  Location: [facility];  Service: Podiatry;  Laterality: Left;   CALCANEAL OSTEOTOMY Left 07/10/2022   Procedure: CALCANEAL OSTEOTOMY;  Surgeon: Edwin Cap, DPM;  Location: [facility];  Service: Podiatry;  Laterality: Left;   COLONOSCOPY WITH PROPOFOL N/A 06/09/2016   Procedure: COLONOSCOPY WITH PROPOFOL;  Surgeon: Meryl Dare, MD;  Location: [facility];  Service: Endoscopy;  Laterality: N/A;   COLONOSCOPY WITH PROPOFOL N/A 10/31/2019   Procedure:  COLONOSCOPY WITH PROPOFOL;  Surgeon: Meryl Dare, MD;  Location: [facility];  Service: Endoscopy;  Laterality: N/A;   FOOT SURGERY     as a child   GASTROC RECESSION EXTREMITY Left 07/10/2022   Procedure: GASTROC RECESSION EXTREMITY;  Surgeon: Edwin Cap, DPM;  Location: [facility];  Service: Podiatry;  Laterality: Left;   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 last one 09/08   URETHROPLASTY       Social History   Tobacco Use  Smoking Status Some Days   Types: Cigars  Smokeless Tobacco Never    Social History   Substance and Sexual Activity  Alcohol Use Yes   Comment: one beer every 4-5 months     Family History  Problem Relation Age of Onset   Hypertension Mother    Hyperlipidemia Mother    Arthritis Mother    Cancer Father    Hyperlipidemia Father    Colon cancer Father 30       dec 59   High Cholesterol Father    Hypertension Father    Cancer Sister 69       skin-melanoma   Melanoma Sister    Heart disease Child        x 3      Current Outpatient Medications:    losartan (COZAAR) 50 MG tablet, Take 1 tablet (50 mg total) by mouth daily., Disp: 90 tablet, Rfl: 1  tirzepatide (ZEPBOUND) 5 MG/0.5ML Pen, Inject 5 mg into the skin once a week., Disp: 2 mL, Rfl: 0   tirzepatide (ZEPBOUND) 7.5 MG/0.5ML Pen, Inject 7.5 mg into the skin once a week., Disp: 2 mL, Rfl: 0   Cardiovascular and other pertinent studies:  Reviewed external labs and tests, independently interpreted  EKG 07/19/2023: Sinus rhythm 88 bpm  Low voltage in precordial leads Otherwise normal EKG    Recent labs: 06/09/2023: Glucose 92, BUN/Cr 22/0.96. EGFR 97. Na/K 134/4.3. Rest of the CMP normal H/H 13/42. MCV 86. Platelets 354 HbA1C 5.9% Chol 206, TG 127, HDL 40, LDL 140 TSH 3.4 normal    Review of Systems  Cardiovascular:  Positive for chest pain. Negative for dyspnea on exertion, leg swelling, palpitations and syncope.         Vitals:    07/19/23 0839 07/19/23 0844  BP: (!) 151/99 (!) 164/103  Pulse: 91 82  Resp: 16   SpO2: 99%      Body mass index is 65.57 kg/m. Filed Weights   07/19/23 0839  Weight: (!) 444 lb (201.4 kg)     Objective:   Physical Exam Vitals and nursing note reviewed.  Constitutional:      General: He is not in acute distress.    Appearance: He is obese.  Neck:     Vascular: No JVD.  Cardiovascular:     Rate and Rhythm: Normal rate and regular rhythm.     Heart sounds: Normal heart sounds. No murmur heard. Pulmonary:     Effort: Pulmonary effort is normal.     Breath sounds: Normal breath sounds. No wheezing or rales.  Musculoskeletal:     Right lower leg: No edema.     Left lower leg: No edema.         Visit diagnoses:   ICD-10-CM   1. Precordial pain  R07.2 EKG 12-Lead    PCV CARDIAC STRESS TEST    PCV ECHOCARDIOGRAM COMPLETE    CT CARDIAC SCORING (SELF PAY ONLY)    2. Morbid obesity (HCC)  E66.01 Ambulatory referral to Sleep Studies    3. Mixed hyperlipidemia  E78.2     4. Primary hypertension  I10 losartan (COZAAR) 100 MG tablet    Ambulatory referral to Sleep Studies    5. Snoring  R06.83 Ambulatory referral to Sleep Studies       Orders Placed This Encounter  Procedures   CT CARDIAC SCORING (SELF PAY ONLY)   Ambulatory referral to Sleep Studies   PCV CARDIAC STRESS TEST   EKG 12-Lead   PCV ECHOCARDIOGRAM COMPLETE     Medication changes this visit: Medications Discontinued During This Encounter  Medication Reason   losartan (COZAAR) 50 MG tablet Reorder    Meds ordered this encounter  Medications   losartan (COZAAR) 100 MG tablet    Sig: Take 1 tablet (100 mg total) by mouth daily.    Dispense:  30 tablet    Refill:  1     Assessment & Recommendations:    42 y.o. Caucasian male with hypertension, hyperlipidemia, morbid obesity, chest pain  Chest pain: Likely noncardiac.  Recommend exercise treadmill stress test, echocardiogram, and calcium  score scan for risk stratification.  Mixed hyperlipidemia: LDL 140.  Ordered calcium score scan for risk stratification.  Hypertension: Uncontrolled.  Discussed diet and lifestyle modifications.  Ordered sleep study. Increase losartan to 100 mg daily.  He will send me a MyChart message in 2 weeks to update me on his blood  pressure readings.  Potentially, he may need another agent such as amlodipine or hydrochlorothiazide.  He has follow-up with PCP in October, where this can be further discussed.  Further recommendations after above testing.   Thank you for referring the patient to [place]. Please feel free to contact with any questions.   Elder Negus, MD Pager: [phone] Office: [phone]

## 2023-07-28 ENCOUNTER — Ambulatory Visit (HOSPITAL_BASED_OUTPATIENT_CLINIC_OR_DEPARTMENT_OTHER): Payer: BC Managed Care – PPO | Admitting: Cardiology

## 2023-07-30 ENCOUNTER — Ambulatory Visit
Admission: RE | Admit: 2023-07-30 | Discharge: 2023-07-30 | Disposition: A | Discharge status: Home / Self Care | Payer: BC Managed Care – PPO | Source: Ambulatory Visit | Attending: Cardiology | Admitting: Cardiology

## 2023-07-30 DIAGNOSIS — R072 Precordial pain: Secondary | ICD-10-CM | POA: Insufficient documentation

## 2023-08-07 ENCOUNTER — Encounter: Payer: Self-pay | Admitting: Family Medicine

## 2023-08-09 ENCOUNTER — Other Ambulatory Visit: Payer: Self-pay | Admitting: Cardiology

## 2023-08-09 ENCOUNTER — Other Ambulatory Visit: Payer: BC Managed Care – PPO

## 2023-08-09 DIAGNOSIS — R072 Precordial pain: Secondary | ICD-10-CM

## 2023-08-10 ENCOUNTER — Other Ambulatory Visit: Payer: Self-pay | Admitting: Family Medicine

## 2023-08-10 ENCOUNTER — Other Ambulatory Visit: Payer: Self-pay | Admitting: Cardiology

## 2023-08-10 DIAGNOSIS — I10 Essential (primary) hypertension: Secondary | ICD-10-CM

## 2023-08-10 MED ORDER — ZEPBOUND 7.5 MG/0.5ML ~~LOC~~ SOAJ: 7.5000 mg | SUBCUTANEOUS | 0 refills | Status: DC

## 2023-08-19 ENCOUNTER — Ambulatory Visit: Payer: BC Managed Care – PPO

## 2023-08-19 DIAGNOSIS — R072 Precordial pain: Secondary | ICD-10-CM

## 2023-09-02 ENCOUNTER — Encounter: Payer: Self-pay | Admitting: Cardiology

## 2023-09-03 ENCOUNTER — Other Ambulatory Visit: Payer: Self-pay | Admitting: *Deleted

## 2023-09-03 ENCOUNTER — Other Ambulatory Visit: Payer: Self-pay | Admitting: Family

## 2023-09-03 MED ORDER — ZEPBOUND 10 MG/0.5ML ~~LOC~~ SOAJ: 10.0000 mg | SUBCUTANEOUS | 1 refills | Status: DC

## 2023-09-03 NOTE — Telephone Encounter (Signed)
I do not see the results returned to the chart.

## 2023-09-13 ENCOUNTER — Ambulatory Visit (INDEPENDENT_AMBULATORY_CARE_PROVIDER_SITE_OTHER): Payer: BC Managed Care – PPO | Admitting: Neurology

## 2023-09-13 ENCOUNTER — Encounter: Payer: Self-pay | Admitting: Neurology

## 2023-09-13 VITALS — BP 147/89 | HR 96 | Ht 68.0 in | Wt >= 6400 oz

## 2023-09-13 DIAGNOSIS — Z9189 Other specified personal risk factors, not elsewhere classified: Secondary | ICD-10-CM | POA: Diagnosis not present

## 2023-09-13 DIAGNOSIS — Z6841 Body Mass Index (BMI) 40.0 and over, adult: Secondary | ICD-10-CM

## 2023-09-13 DIAGNOSIS — R0683 Snoring: Secondary | ICD-10-CM

## 2023-09-13 DIAGNOSIS — R072 Precordial pain: Secondary | ICD-10-CM

## 2023-09-13 DIAGNOSIS — R519 Headache, unspecified: Secondary | ICD-10-CM | POA: Diagnosis not present

## 2023-09-13 DIAGNOSIS — Z820 Family history of epilepsy and other diseases of the nervous system: Secondary | ICD-10-CM

## 2023-09-13 NOTE — Patient Instructions (Signed)

## 2023-09-13 NOTE — Progress Notes (Signed)
Subjective:    Patient ID: Patrick Gallagher is a 42 y.o. male.  HPI    Patrick Foley, MD, PhD [facility] [address] [address] [place], [place] [address]  Dear Dr. Rosemary Holms,   I saw your patient, Patrick Gallagher, upon your kind request in my sleep clinic today for initial consultation of his sleep disorder, in particular, concern for obstructive sleep apnea. The patient is unaccompanied today. As you know, the patient is a very pleasant 42 y.o. male with an underlying medical history of hypertension, biceps tendon injury, Achilles tendon surgery on the left, chest pain and morbid obesity with a BMI of over 60, who reports snoring and excessive daytime somnolence.  His Epworth sleepiness score is 13 out of 24, fatigue severity score is 44 out of 63. I reviewed your office visit note from 07/19/2023.  He has never had a sleep study.  His sister has sleep apnea.  He has occasionally woken up with a headache.  He also wakes up with numbness in his arms particularly if he sleeps a certain way.  Changing positions and shaking his arms out helps to get the feeling back.  His headache is dull and achy and sometimes he takes ibuprofen, not frequently, less than once a week.  He drinks caffeine in limitation, 1 cup of coffee in the morning.  He usually drinks 1 soda per day as well.  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  He has to travel by road quite a bit.  Bedtime is generally between 11 and midnight and rise time around 7:30 AM.  He denies nightly nocturia.  He is working on weight loss.  He recently started Zepbound injections and has lost about 20 pounds thus far.  He lives with his family including wife and 3 children.  He is a non-smoker.  He drinks alcohol occasionally, maybe once a month.  He has a TV in the bedroom but it is typically not on at night.  They have 1 small dog in the household, the dog does not sleep in their bedroom at  night.    His Past Medical History Is Significant For: Past Medical History:  Diagnosis Date   Achilles tendon disorder    Morbid obesity (HCC)    Tendon injury    right forearm bicep   Tubular adenoma of colon 05/2016    His Past Surgical History Is Significant For: Past Surgical History:  Procedure Laterality Date   ACHILLES TENDON SURGERY Left 07/10/2022   Procedure: ACHILLES TENDON REPAIR;  Surgeon: Edwin Cap, DPM;  Location: [facility];  Service: Podiatry;  Laterality: Left;   CALCANEAL OSTEOTOMY Left 07/10/2022   Procedure: CALCANEAL OSTEOTOMY;  Surgeon: Edwin Cap, DPM;  Location: [facility];  Service: Podiatry;  Laterality: Left;   COLONOSCOPY WITH PROPOFOL N/A 06/09/2016   Procedure: COLONOSCOPY WITH PROPOFOL;  Surgeon: Meryl Dare, MD;  Location: [facility];  Service: Endoscopy;  Laterality: N/A;   COLONOSCOPY WITH PROPOFOL N/A 10/31/2019   Procedure: COLONOSCOPY WITH PROPOFOL;  Surgeon: Meryl Dare, MD;  Location: [facility];  Service: Endoscopy;  Laterality: N/A;   FOOT SURGERY     as a child   GASTROC RECESSION EXTREMITY Left 07/10/2022   Procedure: GASTROC RECESSION EXTREMITY;  Surgeon: Edwin Cap, DPM;  Location: [facility];  Service: Podiatry;  Laterality: Left;   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 last one 09/08  URETHROPLASTY      His Family History Is Significant For: Family History  Problem Relation Age of Onset   Hypertension Mother    Hyperlipidemia Mother    Arthritis Mother    Cancer Father    Hyperlipidemia Father    Colon cancer Father 8       dec 59   High Cholesterol Father    Hypertension Father    Cancer Sister 74       skin-melanoma   Melanoma Sister    Heart disease Child        x 3   Sleep apnea Neg Hx     His Social History Is Significant For: Social History   Socioeconomic History   Marital status: Married    Spouse name: Not on file   Number of children: 3   Years of  education: Not on file   Highest education level: Some college, no degree  Occupational History   Occupation: [employer]  Tobacco Use   Smoking status: Some Days    Types: Cigars   Smokeless tobacco: Never  Vaping Use   Vaping status: Never Used  Substance and Sexual Activity   Alcohol use: Yes    Comment: occ   Drug use: Never   Sexual activity: Yes  Other Topics Concern   Not on file  Social History Narrative   [employer]   Social Determinants of Health   Financial Resource Strain: Low Risk  (05/18/2023)   Overall Financial Resource Strain (CARDIA)    Difficulty of Paying Living Expenses: Not very hard  Food Insecurity: No Food Insecurity (05/18/2023)   Hunger Vital Sign    Worried About Running Out of Food in the Last Year: Never true    Ran Out of Food in the Last Year: Never true  Transportation Needs: No Transportation Needs (05/18/2023)   PRAPARE - Administrator, Civil Service (Medical): No    Lack of Transportation (Non-Medical): No  Physical Activity: Insufficiently Active (05/18/2023)   Exercise Vital Sign    Days of Exercise per Week: 1 day    Minutes of Exercise per Session: 10 min  Stress: No Stress Concern Present (05/18/2023)   [employer] of Occupational Health - Occupational Stress Questionnaire    Feeling of Stress : Only a little  Social Connections: Moderately Integrated (05/18/2023)   Social Connection and Isolation Panel [NHANES]    Frequency of Communication with Friends and Family: Three times a week    Frequency of Social Gatherings with Friends and Family: Once a week    Attends Religious Services: 1 to 4 times per year    Active Member of [employer] or Organizations: No    Attends Engineer, structural: Not on file    Marital Status: Married    His Allergies Are:  No Known Allergies:   His Current Medications Are:  Outpatient Encounter Medications as of 09/13/2023  Medication Sig   losartan (COZAAR) 100 MG tablet TAKE 1 TABLET BY  MOUTH EVERY DAY   tirzepatide (ZEPBOUND) 10 MG/0.5ML Pen Inject 10 mg into the skin once a week.   Multiple Vitamin (MULTIVITAMIN) tablet Take 1 tablet by mouth daily.   No facility-administered encounter medications on file as of 09/13/2023.  :   Review of Systems:  Out of a complete 14 point review of systems, all are reviewed and negative with the exception of these symptoms as listed below:  Review of Systems  Neurological:  Pt here for sleep consult  Pt snores,hypertension,headaches ,fatigue Pt denies sleep study,CPAP machine   ESS:13 FSS:44     Objective:  Neurological Exam  Physical Exam Physical Examination:   Vitals:   09/13/23 1523  BP: (!) 147/89  Pulse: 96    General Examination: The patient is a very pleasant 42 y.o. male in no acute distress. He appears well-developed and well-nourished and well groomed.   HEENT: Normocephalic, atraumatic, pupils are equal, round and reactive to light, extraocular tracking is good without limitation to gaze excursion or nystagmus noted. Hearing is grossly intact. Face is symmetric with normal facial animation. Speech is clear with no dysarthria noted. There is no hypophonia. There is no lip, neck/head, jaw or voice tremor. Neck is supple with full range of passive and active motion. There are no carotid bruits on auscultation. Oropharynx exam reveals: mild mouth dryness, good dental hygiene and moderate airway crowding, due to small airway entry, Mallampati class II, tonsillar size of about 2+ bilaterally, minimal overbite noted.  Tongue protrudes centrally and palate elevates symmetrically, neck circumference 18-5/8 inches.  Chest: Clear to auscultation without wheezing, rhonchi or crackles noted.  Heart: S1+S2+0, regular and normal without murmurs, rubs or gallops noted.   Abdomen: Soft, non-tender and non-distended.  Extremities: There is trace pitting edema in the distal lower extremities bilaterally.   Skin: Warm and  dry without trophic changes noted.   Musculoskeletal: exam reveals slightly wider angle on the left.  He is status post Achilles tendon surgery.  Neurologically:  Mental status: The patient is awake, alert and oriented in all 4 spheres. His immediate and remote memory, attention, language skills and fund of knowledge are appropriate. There is no evidence of aphasia, agnosia, apraxia or anomia. Speech is clear with normal prosody and enunciation. Thought process is linear. Mood is normal and affect is normal.  Cranial nerves II - XII are as described above under HEENT exam.  Motor exam: Normal bulk, strength and tone is noted. There is no obvious action or resting tremor.  Fine motor skills and coordination: grossly intact.  Cerebellar testing: No dysmetria or intention tremor. There is no truncal or gait ataxia.  Sensory exam: intact to light touch in the upper and lower extremities.  Gait, station and balance: He stands easily. No veering to one side is noted. No leaning to one side is noted. Posture is age-appropriate and stance is narrow based. Gait shows normal stride length and normal pace. No problems turning are noted.   Assessment and Plan:  In summary, RECE ZECHMAN is a very pleasant 42 y.o.-year old male with an underlying medical history of hypertension, biceps tendon injury, Achilles tendon surgery on the left, chest pain and morbid obesity with a BMI of over 60, whose history and physical exam are concerning for sleep disordered breathing, particularly obstructive sleep apnea (OSA). A laboratory attended sleep study is typically considered "gold standard" for evaluation of sleep disordered breathing.   I had a long chat with the patient about my findings and the diagnosis of sleep apnea, particularly OSA, its prognosis and treatment options. We talked about medical/conservative treatments, surgical interventions and non-pharmacological approaches for symptom control. I explained, in  particular, the risks and ramifications of untreated moderate to severe OSA, especially with respect to developing cardiovascular disease down the road, including congestive heart failure (CHF), difficult to treat hypertension, cardiac arrhythmias (particularly A-fib), neurovascular complications including TIA, stroke and dementia. Even type 2 diabetes has, in part, been linked  to untreated OSA. Symptoms of untreated OSA may include (but may not be limited to) daytime sleepiness, nocturia (i.e. frequent nighttime urination), memory problems, mood irritability and suboptimally controlled or worsening mood disorder such as depression and/or anxiety, lack of energy, lack of motivation, physical discomfort, as well as recurrent headaches, especially morning or nocturnal headaches. We talked about the importance of maintaining a healthy lifestyle and striving for healthy weight. In addition, we talked about the importance of striving for and maintaining good sleep hygiene. I recommended a sleep study at this time. I outlined the differences between a laboratory attended sleep study which is considered more comprehensive and accurate over the option of a home sleep test (HST); the latter may lead to underestimation of sleep disordered breathing in some instances and does not help with diagnosing upper airway resistance syndrome and is not accurate enough to diagnose primary central sleep apnea typically. I outlined possible surgical and non-surgical treatment options of OSA, including the use of a positive airway pressure (PAP) device (i.e. CPAP, AutoPAP/APAP or BiPAP in certain circumstances), a custom-made dental device (aka oral appliance, which would require a referral to a specialist dentist or orthodontist typically, and is generally speaking not considered for patients with full dentures or edentulous state), upper airway surgical options, such as traditional UPPP (which is not considered a first-line treatment)  or the Inspire device (hypoglossal nerve stimulator, which would involve a referral for consultation with an ENT surgeon, after careful selection, following inclusion criteria - also not first-line treatment). I explained the PAP treatment option to the patient in detail, as this is generally considered first-line treatment.  The patient indicated that he would be willing to try PAP therapy, if the need arises. I explained the importance of being compliant with PAP treatment, not only for insurance purposes but primarily to improve patient's symptoms symptoms, and for the patient's long term health benefit, including to reduce His cardiovascular risks longer-term.    We will pick up our discussion about the next steps and treatment options after testing.  We will keep him posted as to the test results by phone call and/or MyChart messaging where possible.  We will plan to follow-up in sleep clinic accordingly as well.  I answered all his questions today and the patient was in agreement.   I encouraged him to call with any interim questions, concerns, problems or updates or email [place] through MyChart.  Generally speaking, sleep test authorizations may take up to 2 weeks, sometimes less, sometimes longer, the patient is encouraged to get in touch with [place] if they do not hear back from the sleep lab staff directly within the next 2 weeks.  Thank you very much for allowing me to participate in the care of this nice patient. If I can be of any further assistance to you please do not hesitate to call me at [phone].  Sincerely,   Patrick Foley, MD, PhD

## 2023-09-16 ENCOUNTER — Telehealth: Payer: Self-pay | Admitting: Neurology

## 2023-09-16 NOTE — Telephone Encounter (Signed)
[facility] pending faxed notes

## 2023-09-17 ENCOUNTER — Ambulatory Visit (INDEPENDENT_AMBULATORY_CARE_PROVIDER_SITE_OTHER): Payer: BC Managed Care – PPO | Admitting: Family Medicine

## 2023-09-17 ENCOUNTER — Encounter: Payer: Self-pay | Admitting: Family Medicine

## 2023-09-17 VITALS — BP 126/80 | HR 86 | Temp 98.5°F | Resp 18 | Ht 68.0 in | Wt >= 6400 oz

## 2023-09-17 DIAGNOSIS — Z6841 Body Mass Index (BMI) 40.0 and over, adult: Secondary | ICD-10-CM | POA: Diagnosis not present

## 2023-09-17 DIAGNOSIS — R7303 Prediabetes: Secondary | ICD-10-CM | POA: Diagnosis not present

## 2023-09-17 DIAGNOSIS — I10 Essential (primary) hypertension: Secondary | ICD-10-CM | POA: Diagnosis not present

## 2023-09-17 NOTE — Progress Notes (Unsigned)
Subjective:     Patient ID: Patrick Gallagher, male    DOB: 1981/03/02, 42 y.o.   MRN: [id number]  Chief Complaint  Patient presents with   Medical Management of Chronic Issues    3 month follow-up on htn Fasting    Gastroesophageal Reflux    Off and on for a month    HPI  HTN - Pt is on losartan 100mg .  Bp's running 130's/80's.  No ha/dizziness/palp/edema/cough/sob.  Some CP in am while driving-heartburn.  Obesity - on zepbound 10mg  weekly. Wt 459 on 6/4-434 today so lost 25# in 4 months!   Some walking. No SE to meds.  Working on diet. On 10mg  this wk only.   Has lost >5%.  Walks a lot at work  OSA - possibly-waiting on sleep study-insurance approval   There are no preventive care reminders to display for this patient.   Past Medical History:  Diagnosis Date   Achilles tendon disorder    Morbid obesity (HCC)    Tendon injury    right forearm bicep   Tubular adenoma of colon 05/2016    Past Surgical History:  Procedure Laterality Date   ACHILLES TENDON SURGERY Left 07/10/2022   Procedure: ACHILLES TENDON REPAIR;  Surgeon: Edwin Cap, DPM;  Location: [facility];  Service: Podiatry;  Laterality: Left;   CALCANEAL OSTEOTOMY Left 07/10/2022   Procedure: CALCANEAL OSTEOTOMY;  Surgeon: Edwin Cap, DPM;  Location: [facility];  Service: Podiatry;  Laterality: Left;   COLONOSCOPY WITH PROPOFOL N/A 06/09/2016   Procedure: COLONOSCOPY WITH PROPOFOL;  Surgeon: Meryl Dare, MD;  Location: [facility];  Service: Endoscopy;  Laterality: N/A;   COLONOSCOPY WITH PROPOFOL N/A 10/31/2019   Procedure: COLONOSCOPY WITH PROPOFOL;  Surgeon: Meryl Dare, MD;  Location: [facility];  Service: Endoscopy;  Laterality: N/A;   FOOT SURGERY     as a child   GASTROC RECESSION EXTREMITY Left 07/10/2022   Procedure: GASTROC RECESSION EXTREMITY;  Surgeon: Edwin Cap, DPM;  Location: [facility];  Service: Podiatry;  Laterality: Left;   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 last one 09/08   URETHROPLASTY       Current Outpatient Medications:    losartan (COZAAR) 100 MG tablet, TAKE 1 TABLET BY MOUTH EVERY DAY, Disp: 90 tablet, Rfl: 1   tirzepatide (ZEPBOUND) 10 MG/0.5ML Pen, Inject 10 mg into the skin once a week., Disp: 2 mL, Rfl: 1  No Known Allergies ROS neg/noncontributory except as noted HPI/below  Having to clear throat occassionally      Objective:     BP 126/80   Pulse 86   Temp 98.5 F (36.9 C) (Temporal)   Resp 18   Ht 5\' 8"  (1.727 m)   Wt (!) 434 lb (196.9 kg)   SpO2 99%   BMI 65.99 kg/m  Wt Readings from Last 3 Encounters:  09/17/23 (!) 434 lb (196.9 kg)  09/13/23 (!) 441 lb (200 kg)  07/19/23 (!) 444 lb (201.4 kg)    Physical Exam   Gen: WDWN NAD HEENT: NCAT, conjunctiva not injected, sclera nonicteric NECK:  supple, no thyromegaly, no nodes, no carotid bruits CARDIAC: RRR, S1S2+, no murmur. DP 2+B LUNGS: CTAB. No wheezes ABDOMEN:  BS+, soft, NTND, No HSM, no masses EXT:  1+ edema MSK: no gross abnormalities.  NEURO: A&O x3.  CN II-XII intact.  PSYCH: normal mood. Good eye contact     Assessment & Plan:  Primary hypertension Assessment &  Plan: Chronic.  Controlled.  Continue 100 mg losartan.  Continue working on diet/exercise/weight loss   Prediabetes Assessment & Plan: Chronic.  Controlled.  Working on diet/exercise   Morbid obesity with BMI of 60.0-69.9, adult (HCC)  Morbid obesity (HCC) Assessment & Plan: Chronic.  Working on diet/exercise.  Currently just started Zepbound 10 mg weekly.  He will let us know in a few weeks whether to continue this dose or increase to 12.5 mg     Return in about 3 months (around 12/18/2023) for HTN,  wt.  I,Rachel Rivera,acting as a scribe for Angelena Sole, MD.,have documented all relevant documentation on the behalf of Angelena Sole, MD,as directed by  Angelena Sole, MD while in the presence of Angelena Sole, MD.  I, Angelena Sole, MD, have reviewed all  documentation for this visit. The documentation on 09/18/23 for the exam, diagnosis, procedures, and orders are all accurate and complete.    Angelena Sole, MD

## 2023-09-17 NOTE — Patient Instructions (Signed)
Pepcid daily as needed for heartburn.

## 2023-09-18 NOTE — Assessment & Plan Note (Signed)
Chronic.  Controlled.  Working on diet/exercise

## 2023-09-18 NOTE — Assessment & Plan Note (Signed)
Chronic.  Controlled.  Continue 100 mg losartan.  Continue working on diet/exercise/weight loss

## 2023-09-18 NOTE — Assessment & Plan Note (Signed)
Chronic.  Working on diet/exercise.  Currently just started Zepbound 10 mg weekly.  He will let us know in a few weeks whether to continue this dose or increase to 12.5 mg

## 2023-09-19 ENCOUNTER — Encounter: Payer: Self-pay | Admitting: Family Medicine

## 2023-09-21 ENCOUNTER — Other Ambulatory Visit: Payer: Self-pay | Admitting: *Deleted

## 2023-09-21 MED ORDER — FAMOTIDINE 20 MG PO TABS: 20.0000 mg | ORAL_TABLET | Freq: Two times a day (BID) | ORAL | 1 refills | Status: DC

## 2023-09-27 NOTE — Telephone Encounter (Signed)
[employer] Berkley Harvey: [id number] (exp. 09/16/23 to 12/17/23)

## 2023-10-05 ENCOUNTER — Other Ambulatory Visit: Payer: Self-pay | Admitting: Family Medicine

## 2023-10-05 MED ORDER — ZEPBOUND 10 MG/0.5ML ~~LOC~~ SOAJ: 10.0000 mg | SUBCUTANEOUS | 1 refills | Status: DC

## 2023-10-05 NOTE — Telephone Encounter (Signed)
[employer]- [employer] Patrick Gallagher: [id number] (exp. 09/16/23 to 12/17/23)   Patient is scheduled at [facility] for 10/19/23 at 8 pm.  Mailed packet to the patient.

## 2023-10-18 ENCOUNTER — Encounter: Payer: Self-pay | Admitting: Family Medicine

## 2023-10-18 NOTE — Telephone Encounter (Signed)
Patient called and r/s his SS due to being sick. He is r/s for 11/23/23 at 9 pm. Mailed new packet to the patient.

## 2023-10-18 NOTE — Telephone Encounter (Signed)
Patient has been scheduled for Dr. Ruthine Dose on 10/18/23 @ 1:30 pm.

## 2023-10-19 ENCOUNTER — Ambulatory Visit
Admission: RE | Admit: 2023-10-19 | Discharge: 2023-10-19 | Disposition: A | Discharge status: Home / Self Care | Payer: BC Managed Care – PPO | Source: Ambulatory Visit | Attending: Family Medicine | Admitting: Family Medicine

## 2023-10-19 ENCOUNTER — Ambulatory Visit (INDEPENDENT_AMBULATORY_CARE_PROVIDER_SITE_OTHER): Payer: BC Managed Care – PPO | Admitting: Family Medicine

## 2023-10-19 VITALS — BP 124/80 | HR 96 | Temp 98.1°F | Ht 68.0 in | Wt >= 6400 oz

## 2023-10-19 DIAGNOSIS — R059 Cough, unspecified: Secondary | ICD-10-CM | POA: Diagnosis not present

## 2023-10-19 DIAGNOSIS — J40 Bronchitis, not specified as acute or chronic: Secondary | ICD-10-CM

## 2023-10-19 DIAGNOSIS — R509 Fever, unspecified: Secondary | ICD-10-CM | POA: Diagnosis not present

## 2023-10-19 DIAGNOSIS — R062 Wheezing: Secondary | ICD-10-CM | POA: Diagnosis not present

## 2023-10-19 LAB — POC COVID19 BINAXNOW: SARS Coronavirus 2 Ag: NEGATIVE

## 2023-10-19 MED ORDER — CEFDINIR 300 MG PO CAPS: 300.0000 mg | ORAL_CAPSULE | Freq: Two times a day (BID) | ORAL | 0 refills | Status: DC

## 2023-10-19 MED ORDER — AZITHROMYCIN 250 MG PO TABS: ORAL_TABLET | ORAL | 0 refills | Status: AC

## 2023-10-19 NOTE — Progress Notes (Signed)
Chest x-ray was normal.  I still want you taking both antibiotics.  If things are worsening, or not improving, please let us know or emergency room if getting bad

## 2023-10-19 NOTE — Patient Instructions (Signed)
get X-ray/labs at [facility].  [address]  hours 8=M-F 8:30-5.  closed 12:30-1 lunch  

## 2023-10-19 NOTE — Progress Notes (Signed)
Subjective:     Patient ID: Patrick Gallagher, male    DOB: 03-25-1981, 42 y.o.   MRN: [id number]  Chief Complaint  Patient presents with   Nasal Congestion    Sx stared on Thursday worsen on Saturday    Cough    Productive, worsen at night  Feeling fluids in longs when laying in bed    Fever    99 F at home     HPI  Cough/Congestion - He complains of productive cough, chest congestion, and fever. Reports he first felt a tickle in his throat on Thursday, but most his sx started on Friday 11/1. States he feels water-like fluid moving when he lies down and when coughing. Fevers are intermittently low grade 99.0 degrees and tend to worsen at nighttime with chills and night sweats. Endorses associated rhinorrhea and sore throat after a coughing fit. No sob when walking down hallways. No unusual swelling.    There are no preventive care reminders to display for this patient.  Past Medical History:  Diagnosis Date   Achilles tendon disorder    Morbid obesity (HCC)    Tendon injury    right forearm bicep   Tubular adenoma of colon 05/2016    Past Surgical History:  Procedure Laterality Date   ACHILLES TENDON SURGERY Left 07/10/2022   Procedure: ACHILLES TENDON REPAIR;  Surgeon: Edwin Cap, DPM;  Location: [facility];  Service: Podiatry;  Laterality: Left;   CALCANEAL OSTEOTOMY Left 07/10/2022   Procedure: CALCANEAL OSTEOTOMY;  Surgeon: Edwin Cap, DPM;  Location: [facility];  Service: Podiatry;  Laterality: Left;   COLONOSCOPY WITH PROPOFOL N/A 06/09/2016   Procedure: COLONOSCOPY WITH PROPOFOL;  Surgeon: Meryl Dare, MD;  Location: [facility];  Service: Endoscopy;  Laterality: N/A;   COLONOSCOPY WITH PROPOFOL N/A 10/31/2019   Procedure: COLONOSCOPY WITH PROPOFOL;  Surgeon: Meryl Dare, MD;  Location: [facility];  Service: Endoscopy;  Laterality: N/A;   FOOT SURGERY     as a child   GASTROC RECESSION EXTREMITY Left 07/10/2022   Procedure: GASTROC RECESSION  EXTREMITY;  Surgeon: Edwin Cap, DPM;  Location: [facility];  Service: Podiatry;  Laterality: Left;   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 last one 09/08   URETHROPLASTY       Current Outpatient Medications:    azithromycin (ZITHROMAX) 250 MG tablet, Take 2 tablets on day 1, then 1 tablet daily on days 2 through 5, Disp: 6 tablet, Rfl: 0   cefdinir (OMNICEF) 300 MG capsule, Take 1 capsule (300 mg total) by mouth 2 (two) times daily., Disp: 14 capsule, Rfl: 0   famotidine (PEPCID) 20 MG tablet, Take 1 tablet (20 mg total) by mouth 2 (two) times daily., Disp: 180 tablet, Rfl: 1   losartan (COZAAR) 100 MG tablet, TAKE 1 TABLET BY MOUTH EVERY DAY, Disp: 90 tablet, Rfl: 1   tirzepatide (ZEPBOUND) 10 MG/0.5ML Pen, Inject 10 mg into the skin once a week., Disp: 6 mL, Rfl: 1  No Known Allergies ROS neg/noncontributory except as noted HPI/below      Objective:     BP 124/80   Pulse 96   Temp 98.1 F (36.7 C) (Temporal)   Ht 5\' 8"  (1.727 m)   Wt (!) 418 lb (189.6 kg)   SpO2 99%   BMI 63.56 kg/m  Wt Readings from Last 3 Encounters:  10/19/23 (!) 418 lb (189.6 kg)  09/17/23 (!) 434 lb (196.9 kg)  09/13/23 Marland Kitchen)  441 lb (200 kg)    Physical Exam   Gen: WDWN NAD HEENT: NCAT, conjunctiva not injected, sclera nonicteric. TM WNL B, OP moist, no exudates.  NECK:  supple, no thyromegaly, no nodes, no carotid bruits CARDIAC: RRR, S1S2+, no murmur.  LUNGS: CTAB. +mildDiffuse expiratory wheezing +Diffuse rhonchi ABDOMEN:  BS+, soft, NTND, No HSM, no masses EXT:  tr edema MSK: no gross abnormalities.  NEURO: A&O x3.  CN II-XII intact.  PSYCH: normal mood. Good eye contact   Results for orders placed or performed in visit on 10/19/23  POC COVID-19  Result Value Ref Range   SARS Coronavirus 2 Ag Negative Negative      Assessment & Plan:  Cough, unspecified type -     POC COVID-19 BinaxNow -     DG Chest 2 View; Future  Bronchitis -     DG Chest 2 View;  Future  Other orders -     Azithromycin; Take 2 tablets on day 1, then 1 tablet daily on days 2 through 5  Dispense: 6 tablet; Refill: 0 -     Cefdinir; Take 1 capsule (300 mg total) by mouth 2 (two) times daily.  Dispense: 14 capsule; Refill: 0   Cough/bronchitis-suspect pneumonia  check cxr,  zpk and omnicef 300mg  bid  worse, sob, etc, ER  No follow-ups on file.   I, Isabelle Course, acting as a scribe for Angelena Sole, MD., have documented all relevant documentation on the behalf of Angelena Sole, MD, as directed by  Angelena Sole, MD while in the presence of Angelena Sole, MD.  I, Angelena Sole, MD, have reviewed all documentation for this visit. The documentation on 10/19/23 for the exam, diagnosis, procedures, and orders are all accurate and complete.  Angelena Sole, MD

## 2023-11-04 ENCOUNTER — Ambulatory Visit: Payer: BC Managed Care – PPO | Attending: Cardiology | Admitting: Cardiology

## 2023-11-04 ENCOUNTER — Encounter: Payer: Self-pay | Admitting: Cardiology

## 2023-11-04 VITALS — BP 128/74 | HR 97 | Resp 16 | Ht 68.0 in | Wt >= 6400 oz

## 2023-11-04 DIAGNOSIS — E782 Mixed hyperlipidemia: Secondary | ICD-10-CM | POA: Diagnosis not present

## 2023-11-04 DIAGNOSIS — I10 Essential (primary) hypertension: Secondary | ICD-10-CM | POA: Diagnosis not present

## 2023-11-04 NOTE — Progress Notes (Signed)
  Cardiology Office Note:  .   Date:  11/04/2023  ID:  Patrick Gallagher, DOB [birth year], MRN [id number] PCP: Patrick Sow, MD  [facility] Providers Cardiologist:  Patrick Mainland, MD PCP: Patrick Sow, MD  Chief Complaint  Patient presents with   Precordial pain   Follow-up           History of Present Illness: .    Patrick Gallagher is a 42 y.o. male with hypertension, mixed hyperlipidemia, obesity  Patient has noted recurrence of chest pain.  Reviewed treadmill stress test and CT cardiac scoring results with the patient, details below.  Vitals:   11/04/23 1405  BP: 128/74  Pulse: 97  Resp: 16  SpO2: 99%     ROS:  ROS   Studies Reviewed: Marland Kitchen        Exercise treadmill stress test 08/19/2023: Exercise treadmill stress test performed using Bruce protocol.  Patient exercised for a total of 4 minutes and 28 seconds, achieving 6.4 METS, and 107% of age predicted maximum heart rate.  Exercise capacity was low.  No chest pain reported.  Exaggerated heart rate response. Normal blood pressure response.  Peak ECG is uninterpretable due to artifact. ECG at 00:59 min into recovery demonstrated sinus tachycardia.  No significant ST-T changes. Low risk study.  CT cardiac scoring 08/02/2023: Calcium score 0  Independently interpreted 05/2023: Chol 206, TG 127, HDL 40, LDL 140     Physical Exam:   Physical Exam Vitals and nursing note reviewed.  Constitutional:      General: He is not in acute distress. Neck:     Vascular: No JVD.  Cardiovascular:     Rate and Rhythm: Normal rate and regular rhythm.     Heart sounds: Normal heart sounds. No murmur heard. Pulmonary:     Effort: Pulmonary effort is normal.     Breath sounds: Normal breath sounds. No wheezing or rales.  Musculoskeletal:     Right lower leg: Edema present.      VISIT DIAGNOSES:   ICD-10-CM   1. Primary hypertension  I10     2. Mixed hyperlipidemia  E78.2        ASSESSMENT  AND PLAN: .    Patrick Gallagher is a 42 y.o. male with  hypertension, mixed hyperlipidemia, obesity  Chest pain: No recurrence. Reassuring stress test-stress test and CT cardiac scoring scan.  Hypertension: Controlled on losartan 100 mg daily.  Continue the same.  Mixed hyperlipidemia: Cholesterol 206, LDL 140 in 05/2023. Calcium score 0. Discussed diet and lifestyle modification, especially reducing red meat and dairy intake. Follow-up with PCP. In future, if cholesterol does not reduce, may be reasonable to add statin. Patient does not want statin, recommend repeat calcium score scan in 2028.        F/u as needed  Signed, Patrick Negus, MD

## 2023-11-04 NOTE — Patient Instructions (Addendum)
Medication Instructions:   Your physician recommends that you continue on your current medications as directed. Please refer to the Current Medication list given to you today.  *If you need a refill on your cardiac medications before your next appointment, please call your pharmacy*     Follow-Up:   AS NEEDED WITH DR. PATWARDHAN     Diet & Lifestyle recommendations:  Physical activity recommendation (The Physical Activity Guidelines for Americans. JAMA 2018;Nov 12) At least 150-300 minutes a week of moderate-intensity, or 75-150 minutes a week of vigorous-intensity aerobic physical activity, or an equivalent combination of moderate- and vigorous-intensity aerobic activity. Adults should perform muscle-strengthening activities on 2 or more days a week. Older adults should do multicomponent physical activity that includes balance training as well as aerobic and muscle-strengthening activities. Benefits of increased physical activity include lower risk of mortality including cardiovascular mortality, lower risk of cardiovascular events and associated risk factors (hypertension and diabetes), and lower risk of many cancers (including bladder, breast, colon, endometrium, esophagus, kidney, lung, and stomach). Additional improvments have been seen in cognition, risk of dementia, anxiety and depression, improved bone health, lower risk of falls, and associated injuries.  Dietary recommendation The 2019 ACC/AHA guidelines promote nutrition as a main fixture of cardiovascular wellness, with a recommendation for a varied diet of fruit, vegetables, fish, legumes, and whole grains (Class I), as well as recommendations to reduce sodium, cholesterol, processed meats, and refined sugars (Class IIa recommendation).10 Sodium intake, a topic of some controversy as of late, is recommended to be kept at 1,500 mg/day or less, far below the average daily intake in the [place] of 3,409 mg/day, and notably below that of  previous [place] recommendations for 300mg /day.10,11 For those unable to reach 1,500 mg/day, they recommend at least a reduction of 1000 mg/day.  A [place]-Mediterranean Diet With Intermittent Fasting: JACC Review Topic of the Week. J Am Coll Cardiol 2020;76:1484-1493 Pes[place]-Mediterranean diet, it is supplemented with extra-virgin olive oil (EVOO), which is the principle fat source, along with moderate amounts of dairy (particularly yogurt and cheese) and eggs, as well as modest amounts of alcohol consumption (ideally red wine with the evening meal), but few red and processed meats.    Mediterranean Diet A Mediterranean diet is based on the traditions of countries on the [employer]. It focuses on eating more: Fruits and vegetables. Whole grains, beans, nuts, and seeds. Heart-healthy fats. These are fats that are good for your heart. It involves eating less: Dairy. Meat and eggs. Processed foods with added sugar, salt, and fat. This type of diet can help prevent certain conditions. It can also improve outcomes if you have a long-term (chronic) disease, such as kidney or heart disease. What are tips for following this plan? Reading food labels Check packaged foods for: The serving size. For foods such as rice and pasta, the serving size is the amount of cooked product, not dry. The total fat. Avoid foods with saturated fat or trans fat. Added sugars, such as corn syrup. Shopping  Try to have a balanced diet. Buy a variety of foods, such as: Fresh fruits and vegetables. You may be able to get these from local farmers markets. You can also buy them frozen. Grains, beans, nuts, and seeds. Some of these can be bought in bulk. Fresh seafood. Poultry and eggs. Low-fat dairy products. Buy whole ingredients instead of foods that have already been packaged. If you can't get fresh seafood, buy precooked frozen shrimp or canned fish, such as tuna, salmon, or  sardines. Stock your pantry so you  always have certain foods on hand, such as olive oil, canned tuna, canned tomatoes, rice, pasta, and beans. Cooking Cook foods with extra-virgin olive oil instead of using butter or other vegetable oils. Have meat as a side dish. Have vegetables or grains as your main dish. This means having meat in small portions or adding small amounts of meat to foods like pasta or stew. Use beans or vegetables instead of meat in common dishes like chili or lasagna. Try out different cooking methods. Try roasting, broiling, steaming, and sauting vegetables. Add frozen vegetables to soups, stews, pasta, or rice. Add nuts or seeds for added healthy fats and plant protein at each meal. You can add these to yogurt, salads, or vegetable dishes. Marinate fish or vegetables using olive oil, lemon juice, garlic, and fresh herbs. Meal planning Plan to eat a vegetarian meal one day each week. Try to work up to two vegetarian meals, if possible. Eat seafood two or more times a week. Have healthy snacks on hand. These may include: Vegetable sticks with hummus. Greek yogurt. Fruit and nut trail mix. Eat balanced meals. These should include: Fruit: 2-3 servings a day. Vegetables: 4-5 servings a day. Low-fat dairy: 2 servings a day. Fish, poultry, or lean meat: 1 serving a day. Beans and legumes: 2 or more servings a week. Nuts and seeds: 1-2 servings a day. Whole grains: 6-8 servings a day. Extra-virgin olive oil: 3-4 servings a day. Limit red meat and sweets to just a few servings a month. Lifestyle  Try to cook and eat meals with your family. Drink enough fluid to keep your pee (urine) pale yellow. Be active every day. This includes: Aerobic exercise, which is exercise that causes your heart to beat faster. Examples include running and swimming. Leisure activities like gardening, walking, or housework. Get 7-8 hours of sleep each night. Drink red wine if your provider says you can. A glass of wine is 5 oz  (150 mL). You may be allowed to have: Up to 1 glass a day if you're male and not pregnant. Up to 2 glasses a day if you're male. What foods should I eat? Fruits Apples. Apricots. Avocado. Berries. Bananas. Cherries. Dates. Figs. Grapes. Lemons. Melon. Oranges. Peaches. Plums. Pomegranate. Vegetables Artichokes. Beets. Broccoli. Cabbage. Carrots. Eggplant. Green beans. Chard. Kale. Spinach. Onions. Leeks. Peas. Squash. Tomatoes. Peppers. Radishes. Grains Whole-grain pasta. Brown rice. Bulgur wheat. Polenta. Couscous. Whole-wheat bread. Orpah Cobb. Meats and other proteins Beans. Almonds. Sunflower seeds. Pine nuts. Peanuts. Cod. Salmon. Scallops. Shrimp. Tuna. Tilapia. Clams. Oysters. Eggs. Chicken or [place] without skin. Dairy Low-fat milk. Cheese. Greek yogurt. Fats and oils Extra-virgin olive oil. Avocado oil. Grapeseed oil. Beverages Water. Red wine. Herbal tea. Sweets and desserts Greek yogurt with honey. Baked apples. Poached pears. Trail mix. Seasonings and condiments Basil. Cilantro. Coriander. Cumin. Mint. Parsley. Sage. Rosemary. Tarragon. Garlic. Oregano. Thyme. Pepper. Balsamic vinegar. Tahini. Hummus. Tomato sauce. Olives. Mushrooms. The items listed above may not be all the foods and drinks you can have. Talk to a dietitian to learn more. What foods should I limit? This is a list of foods that should be eaten rarely. Fruits Fruit canned in syrup. Vegetables Deep-fried potatoes, like [place] fries. Grains Packaged pasta or rice dishes. Cereal with added sugar. Snacks with added sugar. Meats and other proteins Beef. Pork. Lamb. Chicken or [place] with skin. Hot dogs. Tomasa Blase. Dairy Ice cream. Sour cream. Whole milk. Fats and oils Butter. Canola oil. Vegetable oil.  Beef fat (tallow). Lard. Beverages Juice. Sugar-sweetened soft drinks. Beer. Liquor and spirits. Sweets and desserts Cookies. Cakes. Pies. Candy. Seasonings and condiments Mayonnaise. Pre-made sauces  and marinades. The items listed above may not be all the foods and drinks you should limit. Talk to a dietitian to learn more. Where to find more information American Heart Association (AHA): heart.org This information is not intended to replace advice given to you by your health care provider. Make sure you discuss any questions you have with your health care provider. Document Revised: 03/14/2023 Document Reviewed: 03/14/2023 Elsevier Patient Education  2024 [employer].

## 2023-11-23 ENCOUNTER — Ambulatory Visit (INDEPENDENT_AMBULATORY_CARE_PROVIDER_SITE_OTHER): Payer: BC Managed Care – PPO | Admitting: Neurology

## 2023-11-23 DIAGNOSIS — R072 Precordial pain: Secondary | ICD-10-CM

## 2023-11-23 DIAGNOSIS — G4733 Obstructive sleep apnea (adult) (pediatric): Secondary | ICD-10-CM

## 2023-11-23 DIAGNOSIS — Z9189 Other specified personal risk factors, not elsewhere classified: Secondary | ICD-10-CM

## 2023-11-23 DIAGNOSIS — R519 Headache, unspecified: Secondary | ICD-10-CM

## 2023-11-23 DIAGNOSIS — G4720 Circadian rhythm sleep disorder, unspecified type: Secondary | ICD-10-CM

## 2023-11-23 DIAGNOSIS — Z820 Family history of epilepsy and other diseases of the nervous system: Secondary | ICD-10-CM

## 2023-11-23 DIAGNOSIS — R0683 Snoring: Secondary | ICD-10-CM

## 2023-11-29 NOTE — Procedures (Signed)
Physician Interpretation:     [facility] SPLIT NIGHT INTERPRETATION REPORT   STUDY DATE: 11/23/2023     PATIENT NAME:  Patrick Gallagher, Patrick Gallagher         DATE OF BIRTH:  1981/03/14  PATIENT ID:  [id number]    TYPE OF STUDY:  PSG  READING PHYSICIAN: Huston Foley, MD, PhD SCORING TECHNICIAN: Sheena Fields   Referred by: Dr. Truett Mainland ? History and Indication for Testing: 42 y.o. male with an underlying medical history of hypertension, biceps tendon injury, Achilles tendon surgery on the left, chest pain and morbid obesity with a BMI of over 60, who reports snoring and excessive daytime somnolence. His Epworth sleepiness score is 13 out of 24, fatigue severity score is 44 out of 63. Height: 68.0 in Weight: 441 lb (BMI 67) Neck Size: 18.6 in.    Medications: Cozaar, Tirzepatide, Multivitamin DESCRIPTION: A sleep technologist was in attendance for the duration of the recording.  Data collection, scoring, video monitoring, and reporting were performed in compliance with the AASM Manual for the Scoring of Sleep and Associated Events; (Hypopnea is scored based on the criteria listed in Section VIII D. 1b in the AASM Manual V2.6 using a 4% oxygen desaturation rule or Hypopnea is scored based on the criteria listed in Section VIII D. 1a in the AASM Manual V2.6 using 3% oxygen desaturation and /or arousal rule).  A physician certified by the American Board of Sleep Medicine reviewed each epoch of the study.   FINDINGS:  Please refer to the attached summary for additional quantitative information.  STUDY DETAILS: Lights off was at 21:46: and lights on 05:05: (438 minutes hours in bed). This study was performed with an initial diagnostic portion followed by positive airway pressure titration.  DIAGNOSTIC ANALYSIS   SLEEP CONTINUITY AND SLEEP ARCHITECTURE:  The diagnostic portion of the study began at 21:46 and ended at 02:03, for a recording time of 4h 16.103m minutes.   Total sleep time was 143 minutes minutes (11.5% supine;  85.7% lateral;  2.8% prone, 0.0% REM sleep), with a decreased sleep efficiency at 55.9%. Sleep latency was increased at 60.0 minutes. REM sleep was absent. Arousal index was 26.8 /hr. Of the total sleep time, the percentage of stage N1 sleep was 14.3%, stage N2 sleep was 74.6%, stage N3 sleep was 11.1%, and REM sleep was 0.0%. Wake after sleep onset (WASO) time accounted for 53 minutes with moderate to severe sleep fragmentation noted.   AROUSAL (Baseline): There were 48.0 arousals in total, for an arousal index of 20.1 arousals/hour.  Of these, 55.0 were identified as respiratory-related arousals (23.0 /hr), 0 were PLM-related arousals (0.0 /hr), and 10 were non-specific arousals (4.2 /hr)   RESPIRATORY MONITORING:  Based on CMS criteria (using a 4% oxygen desaturation rule for scoring hypopneas), there were 9 apneas (9 obstructive; 0 central; 0 mixed), and 246 hypopneas.  Apnea index was 2.9. Hypopnea index was 87.0. The apnea-hypopnea index was 89.9 overall (15.1 supine; 0.0 REM, 0.0 supine REM). There were 0 respiratory effort-related arousals (RERAs).  The RERA index was 0.0 events/hr. Total respiratory disturbance index (RDI) was 89.9 events/hr. RDI results showed: supine RDI  130.9 /hr; non-supine RDI 84.6 /hr; REM RDI 0.0 /hr, supine REM RDI 0.0 /hr.  Based on AASM criteria (using a 3% oxygen desaturation and /or arousal rule for scoring hypopneas), there were 9 apneas (9 obstructive; 0 central; 0 mixed), and 246 hypopneas.  Apnea index was 2.9. Hypopnea index was 91.6.  The apnea-hypopnea index was 94.5/hour overall (15.1 supine; 0.0 REM, 0.0 supine REM). There were 0 respiratory effort-related arousals (RERAs). Total respiratory disturbance index (RDI) was 94.5 events/hr. RDI results showed: supine RDI 130.9 /hr; non-supine RDI 89.8 /hr; REM RDI 0.0 /hr, supine REM RDI 0.0 /hr.   Respiratory events were associated with oxyhemoglobin  desaturations (nadir 61%) from a normal baseline (mean 96%). Total time spent at, or below 88% was 5.8 minutes, or 4.0%  of total sleep time. Snoring was absent. There were 0.0 occurrences of Cheyne Stokes breathing.     LIMB MOVEMENTS: There were 0 periodic limb movements of sleep (0.0/hr), of which 0 (0.0/hr) were associated with an arousal.     OXIMETRY: Total sleep time spent at, or below 88% was 3.2 minutes, or 2.2% of total sleep time.      BODY POSITION: Duration of total sleep and percent of total sleep in their respective position is as follows: supine 16 minutes minutes (11.5%), non-supine 127.0 minutes (88.5%); right 88 minutes minutes (61.7%), left 34 minutes minutes (24.0%), and prone 04 minutes minutes (2.8%). Total supine REM sleep time was 00 minutes minutes (0.0% of total REM sleep).   Analysis of electrocardiogram activity showed the highest heart rate for the baseline portion of the study was 115.0 beats per minute.  The average heart rate during sleep was 87 bpm, while the highest heart rate for the same period was 101 bpm.    TREATMENT ANALYSIS SLEEP CONTINUITY AND SLEEP ARCHITECTURE:  The treatment portion of the study began at 02:03 and ended at 05:05, for a recording time of 3h 2.52m minutes.  Total sleep time was 123 minutes minutes (15.4% supine;  84.6% lateral; 0.0% prone, 30.0% REM sleep), with a decreased sleep efficiency at 67.9%. Sleep latency was decreased at 4.0 minutes. REM sleep latency was increased at 140.5 minutes.  Arousal index was 15.5 /hr. Of the total sleep time, the percentage of stage N1 sleep was 10.5%, stage N3 sleep was 0.0%, and REM sleep was 30.0%. Wake after sleep onset (WASO) time accounted for 54 minutes with minimal to moderate sleep fragmentation noted.     AROUSAL: There were 21.0 arousals in total, for an arousal index of 10.2 arousals/hour.  Of these, 13.0 were identified as respiratory-related arousals (6.3 /hr), 0 were PLM-related arousals  (0.0 /hr), and 20 were non-specific arousals (9.7 /hr)    RESPIRATORY MONITORING:    While on PAP therapy, based on CMS criteria, the apnea-hypopnea index was 19.4 overall (10.2 supine; 1.0 REM).   While on PAP therapy, based on AASM criteria, the apnea-hypopnea index was 27.2 overall (12.6 supine; 1.9 REM).   Respiratory events were associated with oxyhemoglobin desaturation (nadir 85.0%) from a mean of 97.0%.  Total time spent at, or below 88% was 0.3 minutes, or 0.2%  of total sleep time.    LIMB MOVEMENTS: There were 0 periodic limb movements of sleep (0.0/hr), of which 0 (0.0/hr) were associated with an arousal.     OXIMETRY: Total sleep time spent at, or below 88% was 0.1 minutes, or 0.1% of total sleep time. Snoring was classified as .     BODY POSITION: Duration of total sleep and percent of total sleep in their respective position is as follows: supine 19 minutes minutes (15.4%), non-supine 104.5 minutes (84.6%); right 104 minutes minutes (84.6%), left 00 minutes minutes (0.0%), and prone 00 minutes minutes (0.0%). Total supine REM sleep time was 00 minutes minutes (0.0% of total REM sleep).  Analysis of electrocardiogram activity showed the highest heart rate for the treatment portion of the study was 103.0 beats per minute.  The average heart rate during sleep was 81 bpm, while the highest heart rate for the same period was 100 bpm.   TITRATION DETAILS (SEE ALSO TABLE AT THE END OF THE REPORT):  This patient has severe obstructive sleep apnea and qualified for an emergency split study. The patient was shown several different interfaces and was subsequently fitted with a small Vitera full face mask from [employer].  The patient was started on a pressure of 6 cm of water pressure (as he did not tolerate a lower pressure), and gradually titrated to a final titration pressure of 14 cm.  The AHI was improved at a pressure of 14 cm with EPR of 2, on which the patient achieved a total sleep time  of 12.5 minutes.  Residual AHI was 4.8/hour, O2 nadir of 93%, with non-supine REM sleep achieved.    EEG: Review of the EEG showed no abnormal electrical discharges and symmetrical bihemispheric findings.    EKG: The EKG revealed normal sinus rhythm (NSR). The average heart rate during sleep was 87 bpm during the baseline portion of the study and 81 bpm during the treatment portion of the study.   AUDIO/VIDEO REVIEW: The audio and video review did not show any abnormal or unusual behaviors, movements, phonations or vocalizations. The patient took no restroom breaks. Snoring was noted, in the moderate to loud range.   POST-STUDY QUESTIONNAIRE: Post study, the patient indicated, that sleep was worse than usual.   IMPRESSION:    1. Severe Obstructive Sleep Apnea (OSA) 2. Dysfunctions associated with sleep stages or arousal from sleep   RECOMMENDATIONS:    1. This patient has severe obstructive sleep apnea. The patient qualified for an emergency split sleep study per AASM standards. The baseline AHI was 94.5/h, and O2 nadir 61%. Of note, the absence of REM sleep during the baseline portion of the study likely underestimates his sleep disordered breathing. The patient responded well to PAP therapy. CPAP of 14 cm resulted in significant reduction of his sleep disordered breathing.  I recommend home CPAP therapy at a pressure of 14 cm via small full face mask with heated humidity and EPR of 2 (or mask of choice, sized to fit, EPR as per tolerance). The patient will be advised to be fully compliant with PAP therapy to improve sleep related symptoms and decrease long term cardiovascular risks. Please note, that untreated obstructive sleep apnea may carry additional perioperative morbidity. Patients with significant obstructive sleep apnea should receive perioperative PAP therapy and the surgeons and particularly the anesthesiologist should be informed of the diagnosis and the severity of the sleep disordered  breathing. 2. This study shows sleep fragmentation and abnormal sleep stage percentages; these are nonspecific findings and per se do not signify an intrinsic sleep disorder or a cause for the patient's sleep-related symptoms. Causes include (but are not limited to) the first night effect of the sleep study, circadian rhythm disturbances, medication effect or an underlying mood disorder or medical problem.  3. The patient should be cautioned not to drive, work at heights, or operate dangerous or heavy equipment when tired or sleepy. Review and reiteration of good sleep hygiene measures should be pursued with any patient. 4. The patient will be seen in follow-up in the sleep clinic at [facility] for discussion of the test results, symptom and treatment compliance review, further management strategies, etc. The referring  provider will be notified of the test results.   I certify that I have reviewed the entire raw data recording prior to the issuance of this report in accordance with the Standards of Accreditation of the American Academy of Sleep Medicine (AASM).  Huston Foley, MD, PhD Medical Director, [facility]) Diplomat, ABPN (Neurology and Sleep)             Technical Report:   [facility] Split Summary    General Information  Name: Patrick Gallagher, Patrick Gallagher BMI: 91.47 Physician: Huston Foley, MD  ID: [id number] Height: 68.0 in Technician: Margaretann Loveless, RPSGT  Sex: Male Weight: 441.0 lb Record: [id number]  Age: 38 [[birth year]] Date: 11/23/2023     Medical & Medication History    42 y.o. male with an underlying medical history of hypertension, biceps tendon injury, Achilles tendon surgery on the left, chest pain and morbid obesity with a BMI of over 60, who reports snoring and excessive daytime somnolence.  Cozaar, Tirzepatide, Multivitamin   Sleep Disorder      Comments   The patient came into the lab for a PSG.  The patient was split due to having an AHI greater than 40 after 2 hours of total sleep time. The patient was fitted with a [employer] Vitera (FFM) size S. The mask was a good fit to face, and the patient tolerate the mask well. CPAP was started at [facility] with no EPR. The patient couldn't tolerate any lower pressure. CPAP was increased to 14cmH2O with EPR of 2. At a pressure of 13cm the patient kept waking up. Tech decreased the pressure back to 11cm to help the patient initiate sleep again. Once the patient was sleep CPAP was increased back to 13cmH2O. The patient woke up again therefore, CPAP was decreased back to 11cm. The patient was stable at [facility] while lateral however, the patient is not fixed at this pressure while supine. The patient had no restroom breaks. EKG showed no obvious cardiac arrhythmias. Moderate to loud snoring prior to CPAP placement. Respiratory events scored with a 3% desat. Slept supine and lateral. AHI was 104.3 after 2 hrs of TST.    Baseline Sleep Stage Information Baseline start time: 09:46:34 PM Baseline end time: 02:03:32 AM   Time Total Supine Side Prone Upright  Recording 4h 16.59m 0h 35.40m 2h 46.53m 0h 55.12m 0h 0.26m  Sleep 2h 23.62m 0h 16.37m 2h 3.43m 0h 4.72m 0h 0.52m   Latency N1 N2 N3 REM Onset Per. Slp. Eff.  Actual 0h 0.69m 0h 1.75m 2h 52.59m 0h 0.35m 1h 0.83m 2h 15.64m 55.95%   Stg Dur Wake N1 N2 N3 REM  Total 113.0 20.5 107.0 16.0 0.0  Supine 18.5 4.5 12.0 0.0 0.0  Side 43.5 16.0 91.0 16.0 0.0  Prone 51.0 0.0 4.0 0.0 0.0  Upright 0.0 0.0 0.0 0.0 0.0   Stg % Wake N1 N2 N3 REM  Total 44.1 14.3 74.6 11.1 0.0  Supine 7.2 3.1 8.4 0.0 0.0  Side 17.0 11.1 63.4 11.1 0.0  Prone 19.9 0.0 2.8 0.0 0.0  Upright 0.0 0.0 0.0 0.0 0.0    CPAP Sleep Stage Information CPAP start time: 02:03:32 AM CPAP end time: 05:05:13 AM   Time Total Supine Side Prone Upright  Recording (TRT) 3h 2.81m 0h 52.4m 2h 9.51m 0h 0.94m 0h 0.72m  Sleep (TST) 2h 3.63m 0h 19.64m 1h 44.66m 0h 0.53m 0h 0.37m    Latency N1 N2 N3 REM Onset Per. Slp. Eff.  Actual 0h 0.48m 0h 4.64m 0h 0.51m 2h 20.61m 0h 4.61m 0h 22.9m 67.86%   Stg Dur Wake N1 N2 N3 REM  Total 58.5 13.0 73.5 0.0 37.0  Supine 33.5 7.5 11.5 0.0 0.0  Side 25.0 5.5 62.0 0.0 37.0  Prone 0.0 0.0 0.0 0.0 0.0  Upright 0.0 0.0 0.0 0.0 0.0   Stg % Wake N1 N2 N3 REM  Total 32.1 10.5 59.5 0.0 30.0  Supine 18.4 6.1 9.3 0.0 0.0  Side 13.7 4.5 50.2 0.0 30.0  Prone 0.0 0.0 0.0 0.0 0.0  Upright 0.0 0.0 0.0 0.0 0.0    Baseline Respiratory Information Apnea Summary Sub Supine Side Prone Upright  Total 7 Total 7 6 1  0 0    REM 0 0 0 0 0    NREM 7 6 1  0 0  Obs 7 REM 0 0 0 0 0    NREM 7 6 1  0 0  Mix 0 REM 0 0 0 0 0    NREM 0 0 0 0 0  Cen 0 REM 0 0 0 0 0    NREM 0 0 0 0 0   Rera Summary Sub Supine Side Prone Upright  Total 0 Total 0 0 0 0 0    REM 0 0 0 0 0    NREM 0 0 0 0 0   Hypopnea Summary Sub Supine Side Prone Upright  Total 219 Total 219 30 182 7 0    REM 0 0 0 0 0    NREM 219 30 182 7 0   4% Hypopnea Summary Sub Supine Side Prone Upright  Total (4%) 208 Total 208 30 171 7 0    REM 0 0 0 0 0    NREM 208 30 171 7 0     AHI Total Obs Mix Cen  94.49 Apnea 2.93 2.93 0.00 0.00   Hypopnea 91.57 -- -- --  89.90 Hypopnea (4%) 86.97 -- -- --    Total Supine Side Prone Upright  Position AHI 94.49 130.91 89.27 105.00 0.00  REM AHI 0.00   NREM AHI 94.49   Position RDI 94.49 130.91 89.27 105.00 0.00  REM RDI 0.00   NREM RDI 94.49    4% Hypopnea Total Supine Side Prone Upright  Position AHI (4%) 89.90 130.91 83.90 105.00 0.00  REM AHI (4%) 0.00   NREM AHI (4%) 89.90   Position RDI (4%) 89.90 130.91 83.90 105.00 0.00  REM RDI (4%) 0.00   NREM RDI (4%) 89.90    CPAP Respiratory Information Apnea Summary Sub Supine Side Prone Upright  Total 2 Total 2 2 0 0 0    REM 0 0 0 0 0    NREM 2 2 0 0 0  Obs 2 REM 0 0 0 0 0    NREM 2 2 0 0 0  Mix 0 REM 0 0 0 0 0    NREM 0 0 0 0 0  Cen 0 REM 0 0 0 0 0    NREM 0 0 0 0 0   Rera  Summary Sub Supine Side Prone Upright  Total 0 Total 0 0 0 0 0    REM 0 0 0 0 0    NREM 0 0 0 0 0   Hypopnea Summary Sub Supine Side Prone Upright  Total 54 Total 54 24 30 0 0    REM 4 0 4 0 0    NREM 50 24 26 0 0  4% Hypopnea Summary Sub Supine Side Prone Upright  Total (4%) 38 Total 38 19 19 0 0    REM 2 0 2 0 0    NREM 36 19 17 0 0     AHI Total Obs Mix Cen  27.21 Apnea 0.97 0.97 0.00 0.00   Hypopnea 26.23 -- -- --  19.43 Hypopnea (4%) 18.46 -- -- --    Total Supine Side Prone Upright  Position AHI 27.21 82.11 17.22 0.00 0.00  REM AHI 6.49   NREM AHI 36.07   Position RDI 27.21 82.11 17.22 0.00 0.00  REM RDI 6.49   NREM RDI 36.07    4% Hypopnea Total Supine Side Prone Upright  Position AHI (4%) 19.43 66.32 10.91 0.00 0.00  REM AHI (4%) 3.24   NREM AHI (4%) 26.36   Position RDI (4%) 19.43 66.32 10.91 0.00 0.00  REM RDI (4%) 3.24   NREM RDI (4%) 26.36    Desaturation Information (Baseline)  <100% <90% <80% <70% <60% <50% <40%  Supine 48 29 0 0 0 0 0  Side 217 72 0 0 0 0 0  Prone 20 2 0 0 0 0 0  Upright 0 0 0 0 0 0 0  Total 285 103 0 0 0 0 0  Desaturation threshold setting: 3% Minimum desaturation setting: 10 seconds SaO2 nadir: 61% The longest event was a 59 sec obstructive Hypopneawith a minimum SaO2 of 92%. The lowest SaO2 was 82% associated with a 13 sec obstructive Apnea. Awakening/Arousal Information (Baseline) # of Awakenings 24  Wake after sleep onset 53.17m  Wake after persistent sleep 18.34m   Arousal Assoc. Arousals Index  Apneas 2 0.8  Hypopneas 53 22.2  Leg Movements 0 0.0  Snore 0.0 0.0  PTT Arousals 0 0.0  Spontaneous 10 4.2  Total 64 26.8   Desaturation Information (CPAP)  <100% <90% <80% <70% <60% <50% <40%  Supine 50 5 0 0 0 0 0  Side 48 4 0 0 0 0 0  Prone 0 0 0 0 0 0 0  Upright 0 0 0 0 0 0 0  Total 98 9 0 0 0 0 0  Desaturation threshold setting: 3% Minimum desaturation setting: 10 seconds SaO2 nadir: 85% The longest event was a  54 sec obstructive Hypopnea with a minimum SaO2 of 92%. The lowest SaO2 was 88% associated with a 13 sec obstructive Apnea. Awakening/Arousal Information (CPAP) # of Awakenings 15  Wake after sleep onset 54.58m  Wake after persistent sleep 46.56m   Arousal Assoc. Arousals Index  Apneas 1 0.5  Hypopneas 12 5.8  Leg Movements 0 0.0  Snore 0.0 0.0  PTT Arousals 0 0.0  Spontaneous 20 9.7  Total 32 15.5     EKG Rates (Baseline) EKG Avg Max Min  Awake 93 115 80  Asleep 87 101 77  EKG Events: Tachycardia Myoclonus Information (Baseline) PLMS LMs Index  Total LMs during PLMS 0 0.0  LMs w/ Microarousals 0 0.0   LM LMs Index  w/ Microarousal 0 0.0  w/ Awakening 0 0.0  w/ Resp Event 0 0.0  Spontaneous 0 0.0  Total 0 0.0   EKG Rates (CPAP) EKG Avg Max Min  Awake 84 103 75  Asleep 81 100 71  EKG Events: Tachycardia Myoclonus Information (CPAP) PLMS LMs Index  Total LMs during PLMS 0 0.0  LMs w/ Microarousals 0 0.0   LM LMs Index  w/ Microarousal 0 0.0  w/ Awakening 1 0.5  w/  Resp Event 0 0.0  Spontaneous 1 0.5  Total 1 0.5      Titration Table:  [facility] CPAP/Bilevel Report    General Information  Name: Patrick Gallagher, Patrick Gallagher BMI: 67 Physician: Huston Foley  ID: [id number] Height: 68 in Technician: Margaretann Loveless  Sex: Male Weight: 441 lb Record: [id number]  Age: 34 [06-Sep-1981] Date: 11/23/2023 Scorer: Zenon Mayo Fields   Recommended Settings IPAP: N/A cmH20 EPAP: N/A cmH2O AHI: N/A AHI (4%): N/A   Pressure IPAP/EPAP 00 06 08 10 11 12 13 14    O2 Vol 0.0 0.0 0.0 0.0 0.0 0.0 0.0 0.0  Time TRT 257.66m 10.58m 14.11m 15.35m 52.37m 21.84m 55.28m 13.18m   TST 143.48m 5.35m 8.25m 15.57m 20.17m 21.67m 41.68m 12.45m  Sleep Stage % Wake 44.2 52.4 42.9 0.0 60.6 0.0 26.1 3.8   % REM 0.0 0.0 0.0 0.0 0.0 0.0 59.8 100.0   % N1 14.3 60.0 31.3 0.0 29.3 0.0 3.7 0.0   % N2 74.6 40.0 68.8 100.0 70.7 100.0 36.6 0.0   % N3 11.1 0.0 0.0 0.0 0.0 0.0 0.0 0.0   Respiratory Total Events 226 7 10 1 16 8 13 1    Obs. Apn. 7 0 0 0 2 0 0 0   Mixed Apn. 0 0 0 0 0 0 0 0   Cen. Apn. 0 0 0 0 0 0 0 0   Hypopneas 219 7 10 1 14 8 13 1    AHI 94.49 84.00 75.00 4.00 46.83 22.33 19.02 4.80   Supine AHI 130.91 84.00 48.00 0.00 75.00 0.00 120.00 0.00   Prone AHI 105.00 0.00 0.00 0.00 0.00 0.00 0.00 0.00   Side AHI 89.27 0.00 120.00 4.00 40.00 22.33 5.00 4.80  Respiratory (4%) Hypopneas (4%) 208.00 7.00 6.00 0.00 11.00 4.00 10.00 0.00   AHI (4%) 89.90 84.00 45.00 0.00 38.05 11.16 14.63 0.00   Supine AHI (4%) 130.91 84.00 24.00 0.00 60.00 0.00 96.00 0.00   Prone AHI (4%) 105.00 0.00 0.00 0.00 0.00 0.00 0.00 0.00   Side AHI (4%) 83.90 0.00 80.00 0.00 32.73 11.16 3.33 0.00  Desat Profile <= 90% 22.72m 0.74m 0.26m 0.9m 1.58m 0.39m 0.68m 0.70m   <= 80% 0.84m 0.85m 0.45m 0.56m 0.44m 0.82m 0.47m 0.68m   <= 70% 0.76m 0.71m 0.70m 0.61m 0.55m 0.40m 0.63m 0.84m   <= 60% 0.13m 0.37m 0.48m 0.7m 0.77m 0.49m 0.66m 0.66m  Arousal Index Apnea 0.8 0.0 0.0 0.0 2.9 0.0 0.0 0.0   Hypopnea 22.2 36.0 15.0 0.0 8.8 0.0 5.9 0.0   LM 0.0 0.0 0.0 0.0 0.0 0.0 0.0 0.0   Spontaneous 4.2 24.0 7.5 0.0 14.6 0.0 14.6 9.6

## 2023-11-29 NOTE — Addendum Note (Signed)
Addended by: Huston Foley on: 11/29/2023 05:02 PM   Modules accepted: Orders

## 2023-11-30 ENCOUNTER — Telehealth: Payer: Self-pay

## 2023-11-30 NOTE — Telephone Encounter (Signed)
-----   Message from Huston Foley sent at 11/29/2023  5:02 PM EST ----- Urgent set up requested on PAP therapy, due to severe OSA.   Patient referred by cardiology, seen by me on 09/13/23, patient had a split night sleep study on 11/23/23. Please call and notify patient that the recent sleep study showed severe obstructive sleep apnea (OSA). He did well with CPAP during the study with significant improvement of the respiratory events. I would like start the patient on a CPAP machine for home use. I placed the order in the chart.  Please advise patient that we will need a follow up appointment with either myself or one of our nurse practitioners in about 2-3 months post set-up to check for how they are doing on treatment and how well it's going with the machine in general. Most insurance company require a certain compliance percentage to continue to cover/pay for the machine. Please ask patient to schedule this FU appointment, according to the set-up date, which is the day they receive the machine. Please make sure, the patient understands the importance of keeping this window for the FU appointment, as it is often an Barista and not our rule. Failing to adhere to this may result in losing coverage for sleep apnea treatment, at which point some insurances require repeating the whole process. Plus, monitoring compliance data is usually good feedback for the patient as far as how they are doing, how many hours they are on it, how well the mask fits, etc.  Also remind patient, that any PAP machine or mask issues should be first addressed with the DME company, who provided the machine/supplies.  Please ask if patient has a preference regarding DME company, may depend on the insurance too.  Huston Foley, MD, PhD [facility])

## 2023-11-30 NOTE — Telephone Encounter (Signed)
I called and spoke with the patient's wife, Herbert Seta (per DPR). She will relay the following message to the patient. I advised that Dr. Frances Furbish reviewed his split night study results and found that pt has severe OSA. Dr. Frances Furbish recommends that pt begin CPAP. I reviewed CPAP compliance expectations. Pt's wife was agreeable to having CPAP orders sent to [facility]. I advised pt that an order will be sent to a DME, [facility], and they will call the pt within about one week after they file with the pt's insurance. [facility] will show the pt how to use the machine, fit for masks, and troubleshoot the CPAP if needed.   Herbert Seta will review the information with her husband and he will call back to schedule an appointment. She is aware of the importance of the insurance mandated follow up. I encouraged her to have her husband call back with any further questions or concerns. The order has been placed.

## 2023-12-24 ENCOUNTER — Encounter: Payer: Self-pay | Admitting: Family Medicine

## 2023-12-24 ENCOUNTER — Ambulatory Visit (INDEPENDENT_AMBULATORY_CARE_PROVIDER_SITE_OTHER): Payer: BC Managed Care – PPO | Admitting: Family Medicine

## 2023-12-24 VITALS — BP 137/85 | HR 77 | Temp 97.8°F | Resp 18 | Ht 68.0 in | Wt >= 6400 oz

## 2023-12-24 DIAGNOSIS — G4733 Obstructive sleep apnea (adult) (pediatric): Secondary | ICD-10-CM

## 2023-12-24 DIAGNOSIS — R7303 Prediabetes: Secondary | ICD-10-CM

## 2023-12-24 DIAGNOSIS — I10 Essential (primary) hypertension: Secondary | ICD-10-CM

## 2023-12-24 LAB — HEMOGLOBIN A1C: Hgb A1c MFr Bld: 5.6 % (ref 4.6–6.5)

## 2023-12-24 LAB — COMPREHENSIVE METABOLIC PANEL
ALT: 24 U/L (ref 0–53)
AST: 18 U/L (ref 0–37)
Albumin: 4.2 g/dL (ref 3.5–5.2)
Alkaline Phosphatase: 60 U/L (ref 39–117)
BUN: 20 mg/dL (ref 6–23)
CO2: 28 meq/L (ref 19–32)
Calcium: 9.5 mg/dL (ref 8.4–10.5)
Chloride: 99 meq/L (ref 96–112)
Creatinine, Ser: 0.95 mg/dL (ref 0.40–1.50)
GFR: 98.18 mL/min (ref >60.00)
Glucose, Bld: 93 mg/dL (ref 70–99)
Potassium: 4.4 meq/L (ref 3.5–5.1)
Sodium: 136 meq/L (ref 135–145)
Total Bilirubin: 0.5 mg/dL (ref 0.2–1.2)
Total Protein: 7.5 g/dL (ref 6.0–8.3)

## 2023-12-24 LAB — CBC WITH DIFFERENTIAL/PLATELET
Basophils Absolute: 0 10*3/uL (ref 0.0–0.1)
Basophils Relative: 0.2 % (ref 0.0–3.0)
Eosinophils Absolute: 0.1 10*3/uL (ref 0.0–0.7)
Eosinophils Relative: 1.2 % (ref 0.0–5.0)
HCT: 42.9 % (ref 39.0–52.0)
Hemoglobin: 14.4 g/dL (ref 13.0–17.0)
Lymphocytes Relative: 22 % (ref 12.0–46.0)
Lymphs Abs: 1.6 10*3/uL (ref 0.7–4.0)
MCHC: 33.5 g/dL (ref 30.0–36.0)
MCV: 86.2 fL (ref 78.0–100.0)
Monocytes Absolute: 0.4 10*3/uL (ref 0.1–1.0)
Monocytes Relative: 5 % (ref 3.0–12.0)
Neutro Abs: 5.3 10*3/uL (ref 1.4–7.7)
Neutrophils Relative %: 71.6 % (ref 43.0–77.0)
Platelets: 456 10*3/uL — ABNORMAL HIGH (ref 150.0–400.0)
RBC: 4.97 Mil/uL (ref 4.22–5.81)
RDW: 14.1 % (ref 11.5–15.5)
WBC: 7.4 10*3/uL (ref 4.0–10.5)

## 2023-12-24 LAB — LIPID PANEL
Cholesterol: 176 mg/dL (ref 0–200)
HDL: 28.2 mg/dL — ABNORMAL LOW (ref >39.00)
LDL Cholesterol: 132 mg/dL — ABNORMAL HIGH (ref 0–99)
NonHDL: 147.4
Total CHOL/HDL Ratio: 6
Triglycerides: 75 mg/dL (ref 0.0–149.0)
VLDL: 15 mg/dL (ref 0.0–40.0)

## 2023-12-24 MED ORDER — LOSARTAN POTASSIUM 100 MG PO TABS: 100.0000 mg | ORAL_TABLET | Freq: Every day | ORAL | 1 refills | Status: DC

## 2023-12-24 NOTE — Assessment & Plan Note (Signed)
 New dx  awaiting cpap

## 2023-12-24 NOTE — Patient Instructions (Signed)
 It was very nice to see you today!  Keep up the great work   PLEASE NOTE:  If you had any lab tests please let us  know if you have not heard back within a few days. You may see your results on MyChart before we have a chance to review them but we will give you a call once they are reviewed by us . If we ordered any referrals today, please let us  know if you have not heard from their office within the next week.   Please try these tips to maintain a healthy lifestyle:  Eat most of your calories during the day when you are active. Eliminate processed foods including packaged sweets (pies, cakes, cookies), reduce intake of potatoes, Feldhaus bread, Chieffo pasta, and Sramek rice. Look for whole grain options, oat flour or almond flour.  Each meal should contain half fruits/vegetables, one quarter protein, and one quarter carbs (no bigger than a computer mouse).  Cut down on sweet beverages. This includes juice, soda, and sweet tea. Also watch fruit intake, though this is a healthier sweet option, it still contains natural sugar! Limit to 3 servings daily.  Drink at least 1 glass of water with each meal and aim for at least 8 glasses per day  Exercise at least 150 minutes every week.

## 2023-12-24 NOTE — Assessment & Plan Note (Signed)
 Chronic.  Doing very well on zepbound 10mg  weekly!Marland Kitchen continue

## 2023-12-24 NOTE — Assessment & Plan Note (Signed)
Chronic.  Controlled.  Working on diet/exercise

## 2023-12-24 NOTE — Assessment & Plan Note (Signed)
Chronic.  Controlled.  Continue 100 mg losartan.  Continue working on diet/exercise/weight loss

## 2023-12-24 NOTE — Progress Notes (Signed)
 Subjective:     Patient ID: Patrick Gallagher, male    DOB: 1981/03/10, 42 y.o.   MRN: [id number]  Chief Complaint  Patient presents with   Medical Management of Chronic Issues    3 month follow-up on htn,weight fasting    HPI  HTN - Pt is on losartan  100mg .  Bp's running 130's/80's.  No ha/dizziness/palp/edema/cough/sob.  Some CP iocc-w/u neg.   Obesity - on zepbound  10mg  weekly. Wt 459 on 6/4  Some walking. No SE to meds.  Working on diet.  Walks a lot at work has lost 54 pounds thus far!  Doing well  OSA - new dx.  Awaiting machine 4.  PreDM-working on wt loss-on zepbound  10mg  weekly and doing well.    There are no preventive care reminders to display for this patient.   Past Medical History:  Diagnosis Date   Achilles tendon disorder    HTN (hypertension)    Morbid obesity (HCC)    OSA (obstructive sleep apnea)    Prediabetes    Tendon injury    right forearm bicep   Tubular adenoma of colon 05/2016    Past Surgical History:  Procedure Laterality Date   ACHILLES TENDON SURGERY Left 07/10/2022   Procedure: ACHILLES TENDON REPAIR;  Surgeon: Silva Juliene JONELLE, DPM;  Location: [facility];  Service: Podiatry;  Laterality: Left;   CALCANEAL OSTEOTOMY Left 07/10/2022   Procedure: CALCANEAL OSTEOTOMY;  Surgeon: Silva Juliene JONELLE, DPM;  Location: [facility];  Service: Podiatry;  Laterality: Left;   COLONOSCOPY WITH PROPOFOL  N/A 06/09/2016   Procedure: COLONOSCOPY WITH PROPOFOL ;  Surgeon: Gwendlyn ONEIDA Buddy, MD;  Location: [facility];  Service: Endoscopy;  Laterality: N/A;   COLONOSCOPY WITH PROPOFOL  N/A 10/31/2019   Procedure: COLONOSCOPY WITH PROPOFOL ;  Surgeon: Buddy Gwendlyn ONEIDA, MD;  Location: [facility];  Service: Endoscopy;  Laterality: N/A;   FOOT SURGERY     as a child   GASTROC RECESSION EXTREMITY Left 07/10/2022   Procedure: GASTROC RECESSION EXTREMITY;  Surgeon: Silva Juliene JONELLE, DPM;  Location: [facility];  Service: Podiatry;  Laterality: Left;   URETHRAL 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 last one 09/08   URETHROPLASTY       Current Outpatient Medications:    famotidine  (PEPCID ) 20 MG tablet, Take 1 tablet (20 mg total) by mouth 2 (two) times daily., Disp: 180 tablet, Rfl: 1   tirzepatide  (ZEPBOUND ) 10 MG/0.5ML Pen, Inject 10 mg into the skin once a week., Disp: 6 mL, Rfl: 1   losartan  (COZAAR ) 100 MG tablet, Take 1 tablet (100 mg total) by mouth daily., Disp: 90 tablet, Rfl: 1  No Known Allergies ROS neg/noncontributory except as noted HPI/below  Knees and ankles ache at times.        Objective:     BP 137/85   Pulse 77   Temp 97.8 F (36.6 C) (Temporal)   Resp 18   Ht 5' 8 (1.727 m)   Wt (!) 405 lb 6 oz (183.9 kg)   SpO2 98%   BMI 61.64 kg/m  Wt Readings from Last 3 Encounters:  12/24/23 (!) 405 lb 6 oz (183.9 kg)  11/04/23 (!) 414 lb (187.8 kg)  10/19/23 (!) 418 lb (189.6 kg)    Physical Exam   Gen: WDWN NAD HEENT: NCAT, conjunctiva not injected, sclera nonicteric NECK:  supple, no thyromegaly, no nodes, no carotid bruits CARDIAC: RRR, S1S2+, no murmur. DP 2+B LUNGS: CTAB. No wheezes ABDOMEN:  BS+, soft, NTND,  No HSM, no masses EXT:  1+ edema MSK: no gross abnormalities.  NEURO: A&O x3.  CN II-XII intact.  PSYCH: normal mood. Good eye contact     Assessment & Plan:  Primary hypertension Assessment & Plan: Chronic.  Controlled.  Continue 100 mg losartan .  Continue working on diet/exercise/weight loss  Orders: -     Comprehensive metabolic panel -     Lipid panel -     Losartan  Potassium; Take 1 tablet (100 mg total) by mouth daily.  Dispense: 90 tablet; Refill: 1 -     CBC with Differential/Platelet  Prediabetes Assessment & Plan: Chronic.  Controlled.  Working on diet/exercise  Orders: -     Comprehensive metabolic panel -     Lipid panel -     Hemoglobin A1c  Morbid obesity (HCC) Assessment & Plan: Chronic.  Doing very well on zepbound  10mg  weekly!SABRA continue   OSA (obstructive sleep  apnea) Assessment & Plan: New dx  awaiting cpap     Return in about 3 months (around 03/23/2024) for HTN, wt.     Jenkins CHRISTELLA Carrel, MD

## 2023-12-26 ENCOUNTER — Encounter: Payer: Self-pay | Admitting: Family Medicine

## 2023-12-26 NOTE — Progress Notes (Signed)
 Labs -sugars much better.  Cholesterol still needs work so keep working on it

## 2023-12-28 ENCOUNTER — Other Ambulatory Visit: Payer: Self-pay | Admitting: *Deleted

## 2023-12-28 DIAGNOSIS — R7989 Other specified abnormal findings of blood chemistry: Secondary | ICD-10-CM

## 2024-01-03 DIAGNOSIS — G4733 Obstructive sleep apnea (adult) (pediatric): Secondary | ICD-10-CM | POA: Diagnosis not present

## 2024-01-07 ENCOUNTER — Telehealth: Payer: Self-pay

## 2024-01-07 ENCOUNTER — Other Ambulatory Visit (HOSPITAL_COMMUNITY): Payer: Self-pay

## 2024-01-07 NOTE — Telephone Encounter (Signed)
Pharmacy Patient Advocate Encounter   Received notification from CoverMyMeds that prior authorization for Zepbound 10MG /0.5ML pen-injectors is required/requested.   Insurance verification completed.   The patient is insured through [facility] .   Per test claim: Refill too soon. PA is not needed at this time. Medication was filled 01/05/24. Next eligible fill date is 01/26/24.   Key: [id number] archived in CMM.

## 2024-02-03 DIAGNOSIS — G4733 Obstructive sleep apnea (adult) (pediatric): Secondary | ICD-10-CM | POA: Diagnosis not present

## 2024-02-09 ENCOUNTER — Other Ambulatory Visit: Payer: Self-pay | Admitting: Family Medicine

## 2024-03-02 DIAGNOSIS — G4733 Obstructive sleep apnea (adult) (pediatric): Secondary | ICD-10-CM | POA: Diagnosis not present

## 2024-03-24 ENCOUNTER — Encounter: Payer: Self-pay | Admitting: Family Medicine

## 2024-03-24 ENCOUNTER — Ambulatory Visit: Payer: BC Managed Care – PPO | Admitting: Family Medicine

## 2024-03-24 VITALS — BP 128/84 | HR 78 | Temp 97.5°F | Resp 18 | Ht 68.0 in | Wt 382.0 lb

## 2024-03-24 DIAGNOSIS — R7989 Other specified abnormal findings of blood chemistry: Secondary | ICD-10-CM

## 2024-03-24 DIAGNOSIS — E662 Morbid (severe) obesity with alveolar hypoventilation: Secondary | ICD-10-CM | POA: Diagnosis not present

## 2024-03-24 DIAGNOSIS — R7303 Prediabetes: Secondary | ICD-10-CM

## 2024-03-24 DIAGNOSIS — Z6841 Body Mass Index (BMI) 40.0 and over, adult: Secondary | ICD-10-CM

## 2024-03-24 DIAGNOSIS — I10 Essential (primary) hypertension: Secondary | ICD-10-CM | POA: Diagnosis not present

## 2024-03-24 DIAGNOSIS — G4733 Obstructive sleep apnea (adult) (pediatric): Secondary | ICD-10-CM

## 2024-03-24 LAB — CBC WITH DIFFERENTIAL/PLATELET
Basophils Absolute: 0 10*3/uL (ref 0.0–0.1)
Basophils Relative: 0.3 % (ref 0.0–3.0)
Eosinophils Absolute: 0.1 10*3/uL (ref 0.0–0.7)
Eosinophils Relative: 1.3 % (ref 0.0–5.0)
HCT: 41.5 % (ref 39.0–52.0)
Hemoglobin: 14 g/dL (ref 13.0–17.0)
Lymphocytes Relative: 26.7 % (ref 12.0–46.0)
Lymphs Abs: 1.6 10*3/uL (ref 0.7–4.0)
MCHC: 33.7 g/dL (ref 30.0–36.0)
MCV: 85.3 fl (ref 78.0–100.0)
Monocytes Absolute: 0.3 10*3/uL (ref 0.1–1.0)
Monocytes Relative: 5.7 % (ref 3.0–12.0)
Neutro Abs: 3.8 10*3/uL (ref 1.4–7.7)
Neutrophils Relative %: 66 % (ref 43.0–77.0)
Platelets: 378 10*3/uL (ref 150.0–400.0)
RBC: 4.87 Mil/uL (ref 4.22–5.81)
RDW: 14.6 % (ref 11.5–15.5)
WBC: 5.8 10*3/uL (ref 4.0–10.5)

## 2024-03-24 MED ORDER — ZEPBOUND 10 MG/0.5ML ~~LOC~~ SOAJ: 10.0000 mg | SUBCUTANEOUS | 1 refills | Status: DC

## 2024-03-24 NOTE — Patient Instructions (Signed)
 It was very nice to see you today!  Keep up the great work!   PLEASE NOTE:  If you had any lab tests please let us know if you have not heard back within a few days. You may see your results on MyChart before we have a chance to review them but we will give you a call once they are reviewed by [place]. If we ordered any referrals today, please let us know if you have not heard from their office within the next week.   Please try these tips to maintain a healthy lifestyle:  Eat most of your calories during the day when you are active. Eliminate processed foods including packaged sweets (pies, cakes, cookies), reduce intake of potatoes, white bread, white pasta, and white rice. Look for whole grain options, oat flour or almond flour.  Each meal should contain half fruits/vegetables, one quarter protein, and one quarter carbs (no bigger than a computer mouse).  Cut down on sweet beverages. This includes juice, soda, and sweet tea. Also watch fruit intake, though this is a healthier sweet option, it still contains natural sugar! Limit to 3 servings daily.  Drink at least 1 glass of water with each meal and aim for at least 8 glasses per day  Exercise at least 150 minutes every week.

## 2024-03-24 NOTE — Progress Notes (Signed)
 Subjective:     Patient ID: Patrick Gallagher, male    DOB: [birth year], 43 y.o.   MRN: [id number]  Chief Complaint  Patient presents with   Medical Management of Chronic Issues    3 month follow-up htn, wt Fasting     HPI  HTN - Pt is on losartan 100mg .  Bp's running 130's/80's.  No ha/dizziness/palp/edema/cough/sob.  Some CP iocc-w/u neg.   Obesity - on zepbound 10mg  weekly. Wt 459 on 6/4  Some walking. No SE to meds.  Working on diet.  Walks a lot at work has lost 77 pounds thus far Doing well  OSA - new dx.  On machine M-F.  4.  PreDM-working on wt loss-on zepbound 10mg  weekly and doing well.  Discussed the use of AI scribe software for clinical note transcription with the patient, who gave verbal consent to proceed.  History of Present Illness Patrick Gallagher is a 43 year old male with hypertension and obesity who presents for follow-up on weight management and blood pressure control.  He is currently taking losartan 100 mg for hypertension, with home blood pressure readings consistently ranging from 135/70 to 140/70 mmHg. No headaches, dizziness, chest pain, shortness of breath, or swelling are reported.  He has been actively engaging in outdoor activities, contributing to weight loss. Since starting Zepbound in June, he has lost 77 pounds, bringing his weight below 400 pounds. He is on a 10 mg dose of Zepbound, which effectively suppresses his appetite, and he has not felt the need to increase the dosage.  He uses a CPAP machine for sleep apnea, primarily on weekdays. He reports difficulty sleeping with the machine due to frequent awakenings and adjustments but has switched to a nose pillow for a better seal, improving his sleep quality. His CPAP scores have improved from the 70s and 80s to the 90s, with events per hour reduced to 0.2.  He is managing prediabetes with diet, exercise, and Zepbound. His heartburn is well-controlled with Pepcid, and he no longer feels 'junky'  in his throat. However, he experiences fluid accumulation in his nose and extreme dry mouth in the mornings, which he attributes to the CPAP machine's humidity settings.   His daughter, Gabriel John, is graduating soon and plans to start working. His family, including his wife and children, have been supportive of his health journey, with his daughter also losing weight and taking up roller skating. His son is expected to lose more weight over the summer through his mowing jobs.     There are no preventive care reminders to display for this patient.    Past Medical History:  Diagnosis Date   Achilles tendon disorder    HTN (hypertension)    Morbid obesity (HCC)    OSA (obstructive sleep apnea)    Prediabetes    Tendon injury    right forearm bicep   Tubular adenoma of colon 05/2016    Past Surgical History:  Procedure Laterality Date   ACHILLES TENDON SURGERY Left 07/10/2022   Procedure: ACHILLES TENDON REPAIR;  Surgeon: Floyce Hutching, DPM;  Location: [facility];  Service: Podiatry;  Laterality: Left;   CALCANEAL OSTEOTOMY Left 07/10/2022   Procedure: CALCANEAL OSTEOTOMY;  Surgeon: Floyce Hutching, DPM;  Location: [facility];  Service: Podiatry;  Laterality: Left;   COLONOSCOPY WITH PROPOFOL N/A 06/09/2016   Procedure: COLONOSCOPY WITH PROPOFOL;  Surgeon: Asencion Blacksmith, MD;  Location: [facility];  Service: Endoscopy;  Laterality: N/A;   COLONOSCOPY WITH PROPOFOL  N/A 10/31/2019   Procedure: COLONOSCOPY WITH PROPOFOL;  Surgeon: Asencion Blacksmith, MD;  Location: [facility];  Service: Endoscopy;  Laterality: N/A;   FOOT SURGERY     as a child   GASTROC RECESSION EXTREMITY Left 07/10/2022   Procedure: GASTROC RECESSION EXTREMITY;  Surgeon: Floyce Hutching, DPM;  Location: [facility];  Service: Podiatry;  Laterality: Left;   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 last one 09/08   URETHROPLASTY       Current Outpatient Medications:    famotidine (PEPCID) 20 MG  tablet, TAKE 1 TABLET BY MOUTH TWICE A DAY, Disp: 180 tablet, Rfl: 1   losartan (COZAAR) 100 MG tablet, Take 1 tablet (100 mg total) by mouth daily., Disp: 90 tablet, Rfl: 1   tirzepatide (ZEPBOUND) 10 MG/0.5ML Pen, Inject 10 mg into the skin once a week., Disp: 6 mL, Rfl: 1  No Known Allergies ROS neg/noncontributory except as noted HPI/below  Knees and ankles ache at times.        Objective:     BP 128/84 (BP Location: Left Arm, Patient Position: Sitting, Cuff Size: Large)   Pulse 78   Temp (!) 97.5 F (36.4 C) (Temporal)   Resp 18   Ht 5\' 8"  (1.727 m)   Wt (!) 382 lb (173.3 kg)   SpO2 100%   BMI 58.08 kg/m  Wt Readings from Last 3 Encounters:  03/24/24 (!) 382 lb (173.3 kg)  12/24/23 (!) 405 lb 6 oz (183.9 kg)  11/04/23 (!) 414 lb (187.8 kg)    Physical Exam   Gen: WDWN NAD HEENT: NCAT, conjunctiva not injected, sclera nonicteric NECK:  supple, no thyromegaly, no nodes, no carotid bruits CARDIAC: RRR, S1S2+, no murmur. DP 2+B LUNGS: CTAB. No wheezes ABDOMEN:  BS+, soft, NTND, No HSM, no masses EXT:  1+ edema MSK: no gross abnormalities.  NEURO: A&O x3.  CN II-XII intact.  PSYCH: normal mood. Good eye contact     Assessment & Plan:  Primary hypertension  OSA (obstructive sleep apnea)  Prediabetes  Elevated platelet count -     CBC with Differential/Platelet  Obesity with alveolar hypoventilation and body mass index (BMI) of 40 or greater (HCC)  Other orders -     Zepbound; Inject 10 mg into the skin once a week.  Dispense: 6 mL; Refill: 1  Assessment and Plan Assessment & Plan Obesity   He has lost 77 pounds since starting Zepbound in June, with his current weight now under 400 pounds. Zepbound 10 mg effectively suppresses his appetite, so no dosage increase is needed. He is engaging in more physical activity and is encouraged to continue. Discussed the potential for a weight loss plateau and the importance of preserving dosage increases for future  needs. Continue Zepbound 10 mg and encourage increased physical activity.  Hypertension   His hypertension is well-controlled with losartan 100 mg, with home blood pressure readings consistently around 135-140/70-80 mmHg. He reports no symptoms of headache, dizziness, chest pain, shortness of breath, or swelling. Continue losartan 100 mg daily and encourage regular exercise.  Prediabetes   Prediabetes is managed with diet, exercise, and Zepbound. Weight loss and lifestyle changes are improving his metabolic health. Continue current management with diet, exercise, and Zepbound.  Obstructive Sleep Apnea   He uses a CPAP machine with a nasal pillow, improving sleep apnea scores to 93-100. Reports discomfort and dry mouth, especially in the mornings, and takes breaks from CPAP use on weekends. Advised to  use CPAP consistently, including weekends, to prevent symptom recurrence. Improved seal with nasal pillow noted, reducing events to 0.2 per hour. Encourage consistent use of CPAP, including weekends, and consider addressing dry mouth and discomfort with CPAP adjustments.  Gastroesophageal Reflux Disease (GERD)   Heartburn is well-controlled with Pepcid, with less throat discomfort and no significant GERD symptoms. Noted fluid accumulation in the mornings possibly related to CPAP use. Continue Pepcid as needed.  Follow-up   Plans discussed for monitoring blood parameters and ensuring continued progress in weight loss and health management. Monitor platelets now and check cholesterol at the next visit due to previous high levels but recent improvements. Order CBC to monitor platelet levels, plan to check cholesterol levels at next visit, and follow up on GI referral for Jacquline Maw if no response in a week.     Return in about 3 months (around 06/23/2024) for chronic follow-up.     Ellsworth Haas, MD

## 2024-03-25 ENCOUNTER — Encounter: Payer: Self-pay | Admitting: Family Medicine

## 2024-03-25 NOTE — Progress Notes (Signed)
 normal

## 2024-04-02 DIAGNOSIS — G4733 Obstructive sleep apnea (adult) (pediatric): Secondary | ICD-10-CM | POA: Diagnosis not present

## 2024-05-02 DIAGNOSIS — G4733 Obstructive sleep apnea (adult) (pediatric): Secondary | ICD-10-CM | POA: Diagnosis not present

## 2024-06-02 DIAGNOSIS — G4733 Obstructive sleep apnea (adult) (pediatric): Secondary | ICD-10-CM | POA: Diagnosis not present

## 2024-06-05 ENCOUNTER — Telehealth: Payer: Self-pay

## 2024-06-05 ENCOUNTER — Other Ambulatory Visit (HOSPITAL_COMMUNITY): Payer: Self-pay

## 2024-06-05 NOTE — Telephone Encounter (Signed)
 Pharmacy Patient Advocate Encounter   Received notification from CoverMyMeds that prior authorization for Zebound 10 is required/requested.   Insurance verification completed.   The patient is insured through [facility] .   Per test claim: The current 84 day co-pay is, $24.99.  No PA needed at this time. This test claim was processed through [facility]- copay amounts may vary at other pharmacies due to pharmacy/plan contracts, or as the patient moves through the different stages of their insurance plan.

## 2024-06-05 NOTE — Telephone Encounter (Signed)
 Noted

## 2024-06-27 ENCOUNTER — Ambulatory Visit (INDEPENDENT_AMBULATORY_CARE_PROVIDER_SITE_OTHER): Admitting: Family Medicine

## 2024-06-27 ENCOUNTER — Encounter: Payer: Self-pay | Admitting: Family Medicine

## 2024-06-27 VITALS — BP 120/84 | HR 84 | Temp 97.5°F | Resp 18 | Ht 68.0 in | Wt 376.1 lb

## 2024-06-27 DIAGNOSIS — R7303 Prediabetes: Secondary | ICD-10-CM

## 2024-06-27 DIAGNOSIS — E782 Mixed hyperlipidemia: Secondary | ICD-10-CM | POA: Diagnosis not present

## 2024-06-27 DIAGNOSIS — I10 Essential (primary) hypertension: Secondary | ICD-10-CM | POA: Diagnosis not present

## 2024-06-27 LAB — COMPREHENSIVE METABOLIC PANEL WITH GFR
ALT: 28 U/L (ref 0–53)
AST: 18 U/L (ref 0–37)
Albumin: 4.4 g/dL (ref 3.5–5.2)
Alkaline Phosphatase: 63 U/L (ref 39–117)
BUN: 20 mg/dL (ref 6–23)
CO2: 27 meq/L (ref 19–32)
Calcium: 9.5 mg/dL (ref 8.4–10.5)
Chloride: 100 meq/L (ref 96–112)
Creatinine, Ser: 0.89 mg/dL (ref 0.40–1.50)
GFR: 104.73 mL/min (ref >60.00)
Glucose, Bld: 90 mg/dL (ref 70–99)
Potassium: 4.1 meq/L (ref 3.5–5.1)
Sodium: 135 meq/L (ref 135–145)
Total Bilirubin: 0.5 mg/dL (ref 0.2–1.2)
Total Protein: 7.8 g/dL (ref 6.0–8.3)

## 2024-06-27 LAB — LIPID PANEL
Cholesterol: 193 mg/dL (ref 0–200)
HDL: 29.6 mg/dL — ABNORMAL LOW (ref >39.00)
LDL Cholesterol: 142 mg/dL — ABNORMAL HIGH (ref 0–99)
NonHDL: 163.24
Total CHOL/HDL Ratio: 7
Triglycerides: 106 mg/dL (ref 0.0–149.0)
VLDL: 21.2 mg/dL (ref 0.0–40.0)

## 2024-06-27 LAB — CBC WITH DIFFERENTIAL/PLATELET
Basophils Absolute: 0 K/uL (ref 0.0–0.1)
Basophils Relative: 0.4 % (ref 0.0–3.0)
Eosinophils Absolute: 0.1 K/uL (ref 0.0–0.7)
Eosinophils Relative: 0.9 % (ref 0.0–5.0)
HCT: 44.6 % (ref 39.0–52.0)
Hemoglobin: 15.2 g/dL (ref 13.0–17.0)
Lymphocytes Relative: 20.8 % (ref 12.0–46.0)
Lymphs Abs: 1.6 K/uL (ref 0.7–4.0)
MCHC: 34.1 g/dL (ref 30.0–36.0)
MCV: 84.3 fl (ref 78.0–100.0)
Monocytes Absolute: 0.4 K/uL (ref 0.1–1.0)
Monocytes Relative: 5.3 % (ref 3.0–12.0)
Neutro Abs: 5.6 K/uL (ref 1.4–7.7)
Neutrophils Relative %: 72.6 % (ref 43.0–77.0)
Platelets: 383 K/uL (ref 150.0–400.0)
RBC: 5.3 Mil/uL (ref 4.22–5.81)
RDW: 14.7 % (ref 11.5–15.5)
WBC: 7.7 K/uL (ref 4.0–10.5)

## 2024-06-27 LAB — TSH: TSH: 3.01 u[IU]/mL (ref 0.35–5.50)

## 2024-06-27 LAB — MAGNESIUM: Magnesium: 2.3 mg/dL (ref 1.5–2.5)

## 2024-06-27 LAB — HEMOGLOBIN A1C: Hgb A1c MFr Bld: 5.7 % (ref 4.6–6.5)

## 2024-06-27 MED ORDER — LOSARTAN POTASSIUM 100 MG PO TABS: 100.0000 mg | ORAL_TABLET | Freq: Every day | ORAL | 1 refills | Status: DC

## 2024-06-27 MED ORDER — FAMOTIDINE 20 MG PO TABS: 20.0000 mg | ORAL_TABLET | Freq: Two times a day (BID) | ORAL | 1 refills | Status: AC

## 2024-06-27 NOTE — Patient Instructions (Signed)
 It was very nice to see you today!  Daily multivitamin Drink more water. Eat lunch  Check on the mocha   PLEASE NOTE:  If you had any lab tests please let us  know if you have not heard back within a few days. You may see your results on MyChart before we have a chance to review them but we will give you a call once they are reviewed by us . If we ordered any referrals today, please let us  know if you have not heard from their office within the next week.   Please try these tips to maintain a healthy lifestyle:  Eat most of your calories during the day when you are active. Eliminate processed foods including packaged sweets (pies, cakes, cookies), reduce intake of potatoes, white bread, white pasta, and white rice. Look for whole grain options, oat flour or almond flour.  Each meal should contain half fruits/vegetables, one quarter protein, and one quarter carbs (no bigger than a computer mouse).  Cut down on sweet beverages. This includes juice, soda, and sweet tea. Also watch fruit intake, though this is a healthier sweet option, it still contains natural sugar! Limit to 3 servings daily.  Drink at least 1 glass of water with each meal and aim for at least 8 glasses per day  Exercise at least 150 minutes every week.

## 2024-06-27 NOTE — Progress Notes (Signed)
 Subjective:     Patient ID: Patrick Gallagher, male    DOB: Feb 22, 1981, 42 y.o.   MRN: [id number]  Chief Complaint  Patient presents with   Medical Management of Chronic Issues    3 month follow-up Fasting     HPI-83 Discussed the use of AI scribe software for clinical note transcription with the patient, who gave verbal consent to proceed.  History of Present Illness Patrick Gallagher is a 43 year old male with hypertension, hyperlipidemia, and prediabetes who presents for follow-up on his weight management.  He has been on Zepbound , resulting in a weight loss of 83 pounds over the past year, though the rate has slowed to six pounds in the last three months. He maintains an exercise and diet regimen, consuming a large iced mocha coffee throughout the day and typically having a meal of sausage and eggs or bacon and eggs for dinner. He rarely eats breakfast and often skips lunch. He is currently on 10 mg of Zepbound  and feels his appetite is well-controlled, often eating 'one little meal a day.' He has not been tracking his caloric intake   His heartburn is well-managed with Pepcid . No severe constipation is noted, and his prediabetes is being managed through diet and exercise. He is not on medication for hyperlipidemia, hoping lifestyle changes will suffice.  For hypertension, he takes losartan  100 mg daily. At home, his blood pressure readings are typically in the 130s/80s. He experiences occasional lightheadedness when bending over and admits to not drinking enough water. No major headaches or dizziness are reported, but he mentions occasional sharp pains in various locations, described as 'like a muscle spasm.'  He uses a CPAP machine and sometimes feels lung discomfort upon waking, which he attributes to the machine. He experiences hip pain, particularly in the groin area when lifting his leg, which he suspects may be due to increased activity or insufficient stretching. He  acknowledges needing to increase his water intake.    Health Maintenance Due  Topic Date Due   Hepatitis B Vaccines (1 of 3 - 19+ 3-dose series) Never done   HPV VACCINES (1 - 3-dose SCDM series) Never done    Past Medical History:  Diagnosis Date   Achilles tendon disorder    HTN (hypertension)    Morbid obesity (HCC)    OSA (obstructive sleep apnea)    Prediabetes    Tendon injury    right forearm bicep   Tubular adenoma of colon 05/2016    Past Surgical History:  Procedure Laterality Date   ACHILLES TENDON SURGERY Left 07/10/2022   Procedure: ACHILLES TENDON REPAIR;  Surgeon: Silva Juliene JONELLE, DPM;  Location: [facility];  Service: Podiatry;  Laterality: Left;   CALCANEAL OSTEOTOMY Left 07/10/2022   Procedure: CALCANEAL OSTEOTOMY;  Surgeon: Silva Juliene JONELLE, DPM;  Location: [facility];  Service: Podiatry;  Laterality: Left;   COLONOSCOPY WITH PROPOFOL  N/A 06/09/2016   Procedure: COLONOSCOPY WITH PROPOFOL ;  Surgeon: Gwendlyn ONEIDA Buddy, MD;  Location: [facility];  Service: Endoscopy;  Laterality: N/A;   COLONOSCOPY WITH PROPOFOL  N/A 10/31/2019   Procedure: COLONOSCOPY WITH PROPOFOL ;  Surgeon: Buddy Gwendlyn ONEIDA, MD;  Location: [facility];  Service: Endoscopy;  Laterality: N/A;   FOOT SURGERY     as a child   GASTROC RECESSION EXTREMITY Left 07/10/2022   Procedure: GASTROC RECESSION EXTREMITY;  Surgeon: Silva Juliene JONELLE, DPM;  Location: [facility];  Service: Podiatry;  Laterality: Left;   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 last one 09/08   URETHROPLASTY       Current Outpatient Medications:    tirzepatide  (ZEPBOUND ) 10 MG/0.5ML Pen, Inject 10 mg into the skin once a week., Disp: 6 mL, Rfl: 1   famotidine  (PEPCID ) 20 MG tablet, Take 1 tablet (20 mg total) by mouth 2 (two) times daily., Disp: 180 tablet, Rfl: 1   losartan  (COZAAR ) 100 MG tablet, Take 1 tablet (100 mg total) by mouth daily., Disp: 90 tablet, Rfl: 1  No Known Allergies ROS neg/noncontributory  except as noted HPI/below      Objective:     BP 120/84 (BP Location: Right Arm, Patient Position: Sitting, Cuff Size: Large)   Pulse 84   Temp (!) 97.5 F (36.4 C) (Temporal)   Resp 18   Ht 5' 8 (1.727 m)   Wt (!) 376 lb 2 oz (170.6 kg)   SpO2 100%   BMI 57.19 kg/m  Wt Readings from Last 3 Encounters:  06/27/24 (!) 376 lb 2 oz (170.6 kg)  03/24/24 (!) 382 lb (173.3 kg)  12/24/23 (!) 405 lb 6 oz (183.9 kg)    Physical Exam   Gen: WDWN NAD HEENT: NCAT, conjunctiva not injected, sclera nonicteric NECK:  supple, no thyromegaly, no nodes, no carotid bruits CARDIAC: RRR, S1S2+, no murmur. DP 2+B LUNGS: CTAB. No wheezes ABDOMEN:  BS+, soft, NTND, No HSM, no masses EXT:  no edema MSK: no gross abnormalities.  NEURO: A&O x3.  CN II-XII intact.  PSYCH: normal mood. Good eye contact     Assessment & Plan:  Primary hypertension -     Lipid panel -     Comprehensive metabolic panel with GFR -     CBC with Differential/Platelet -     Hemoglobin A1c -     TSH -     Magnesium -     Losartan  Potassium; Take 1 tablet (100 mg total) by mouth daily.  Dispense: 90 tablet; Refill: 1  Mixed hyperlipidemia -     Lipid panel -     Comprehensive metabolic panel with GFR  Prediabetes -     Comprehensive metabolic panel with GFR -     Hemoglobin A1c -     TSH  Other orders -     Famotidine ; Take 1 tablet (20 mg total) by mouth 2 (two) times daily.  Dispense: 180 tablet; Refill: 1  Assessment and Plan Assessment & Plan Weight Management   Loc has lost 83 pounds over the past year with Zepbound , but his weight loss has plateaued, losing only 6 pounds in the last three months. This may be due to inadequate caloric intake and excessive sugar consumption from iced mocha coffee. His current diet consists of minimal food intake, primarily a high-calorie coffee drink and a single meal per day, which may be insufficient for his nutritional needs. Maintain Zepbound  at 10 mg. Track daily  caloric intake and reduce sugar intake, particularly from iced mocha coffee. Increase protein intake, possibly through protein drinks or [place] yogurt, and ensure adequate nutritional intake throughout the day.  Prediabetes   Precious's prediabetes is managed through dietary modifications. Reducing sugar intake and increasing protein are crucial to stabilize insulin levels and support weight loss. Reduce sugar intake, increase protein intake, and monitor blood glucose levels.  Hypertension -chronic. controlled  Rani is on losartan  100 mg daily for hypertension. His home blood pressure readings are generally in the 130s/80s, which is acceptable. However, he experiences elevated readings in the  clinic, likely due to anxiety or situational factors. Second reading normal.  Continue losartan  100 mg daily, monitor blood pressure at home regularly, and practice relaxation techniques to manage anxiety during clinic visits.  Hyperlipidemia   Trung is managing his hyperlipidemia through diet and exercise without medication. Monitoring his progress is essential to determine if lifestyle changes are effective or if genetic factors are influencing his cholesterol levels. Continue diet and exercise regimen and monitor cholesterol levels.  Intermittent Chest Pain   Caron experiences intermittent sharp chest pains that are not localized and may be muscular or related to reflux. A previous cardiac workup ruled out cardiac causes. Dehydration and inadequate nutrition may contribute to these symptoms. Increase water intake to at least 100 ounces per day, take a daily multivitamin, and monitor symptoms and report any changes.  Heartburn   Taquan's heartburn is well-controlled with Pepcid . There are no significant issues with constipation or other gastrointestinal symptoms. Continue Pepcid  as needed and refill Pepcid  prescription.  Hip Pain   Ashwin reports pain in his groin area when lifting his leg, which may be due  to nerve pinching or inadequate stretching. Consider magnesium supplementation, and increase water intake.  General Health Maintenance   Adequate hydration, nutritional intake, and vitamin supplementation are emphasized to support overall health and prevent symptoms like dizziness and muscle spasms. Increase water intake to at least 100 ounces per day, take a daily multivitamin, and ensure adequate nutritional intake throughout the day.  Follow-up   Brecken's progress with weight management, blood pressure, and other health concerns will be monitored in the coming months. Schedule follow-up appointment in a few months.    Return in about 3 months (around 09/27/2024) for chronic follow-up.  Jenkins CHRISTELLA Carrel, MD

## 2024-06-28 ENCOUNTER — Other Ambulatory Visit: Payer: Self-pay | Admitting: *Deleted

## 2024-06-28 ENCOUNTER — Ambulatory Visit: Payer: Self-pay | Admitting: Family Medicine

## 2024-06-28 MED ORDER — ATORVASTATIN CALCIUM 20 MG PO TABS: 20.0000 mg | ORAL_TABLET | Freq: Every day | ORAL | 0 refills | Status: DC

## 2024-06-28 NOTE — Progress Notes (Signed)
 Labs great except cholesterol hasn't improved.  continue working on diet/exercise.  Consider meds.  If willing, start Lipitor 20mg  daily #90/0

## 2024-07-02 ENCOUNTER — Encounter: Payer: Self-pay | Admitting: Cardiology

## 2024-07-04 NOTE — Telephone Encounter (Signed)
 Do you want to see about double booking a slot?

## 2024-07-04 NOTE — Telephone Encounter (Signed)
 Okay to add tomorrow 7/23 at 3 PM.  Thanks MJP

## 2024-07-04 NOTE — Telephone Encounter (Signed)
 FYI

## 2024-07-05 ENCOUNTER — Other Ambulatory Visit (HOSPITAL_COMMUNITY): Payer: Self-pay

## 2024-07-05 ENCOUNTER — Encounter: Payer: Self-pay | Admitting: Cardiology

## 2024-07-05 ENCOUNTER — Ambulatory Visit: Attending: Cardiology | Admitting: Cardiology

## 2024-07-05 VITALS — BP 151/108 | HR 81 | Ht 68.0 in | Wt 376.0 lb

## 2024-07-05 DIAGNOSIS — R072 Precordial pain: Secondary | ICD-10-CM | POA: Diagnosis not present

## 2024-07-05 DIAGNOSIS — I10 Essential (primary) hypertension: Secondary | ICD-10-CM

## 2024-07-05 DIAGNOSIS — E782 Mixed hyperlipidemia: Secondary | ICD-10-CM

## 2024-07-05 MED ORDER — METOPROLOL SUCCINATE ER 50 MG PO TB24
50.0000 mg | ORAL_TABLET | Freq: Every evening | ORAL | 3 refills | Status: DC
Start: 2024-07-05 — End: 2024-11-02
  Filled 2024-07-05: qty 90, 90d supply, fill #0

## 2024-07-05 NOTE — Progress Notes (Signed)
 Cardiology Office Note:  .   Date:  07/05/2024  ID:  Patrick Gallagher, DOB [birth year], MRN [id number] PCP: Wendolyn Jenkins Jansky, MD  [facility] Providers Cardiologist:  Newman Lawrence, MD PCP: Wendolyn Jenkins Jansky, MD  Chief Complaint  Patient presents with   Chest Pain      History of Present Illness: .    Patrick Gallagher is a 43 y.o. male with hypertension, mixed hyperlipidemia, obesity  Patient is here with his wife today.  He has episodes of sharp chest pain lasting for few seconds, as well as some chest tightness that lasted for hours.  Both types of chest pain are unrelated to physical exertion.  He is having episodes of lightheadedness specially when standing up.  He admits to not drinking enough water.  He is compliant with his losartan , blood pressure mains elevated.  He recently started a statin medication.   Vitals:   07/05/24 1500  BP: (!) 151/108  Pulse: 81  SpO2: 98%      ROS:  Review of Systems  Cardiovascular:  Positive for chest pain. Negative for dyspnea on exertion, leg swelling, palpitations and syncope.     Studies Reviewed: SABRA        EKG 07/05/2024: Normal sinus rhythm Septal infarct , age undetermined No previous ECGs available    Exercise treadmill stress test 08/19/2023: Exercise treadmill stress test performed using Bruce protocol.  Patient exercised for a total of 4 minutes and 28 seconds, achieving 6.4 METS, and 107% of age predicted maximum heart rate.  Exercise capacity was low.  No chest pain reported.  Exaggerated heart rate response. Normal blood pressure response.  Peak ECG is uninterpretable due to artifact. ECG at 00:59 min into recovery demonstrated sinus tachycardia.  No significant ST-T changes. Low risk study.  CT cardiac scoring 08/02/2023: Calcium  score 0  Labs 05/2023: Chol 206, TG 127, HDL 40, LDL 140     Physical Exam:   Physical Exam Vitals and nursing note reviewed.  Constitutional:      General: He is not  in acute distress. Neck:     Vascular: No JVD.  Cardiovascular:     Rate and Rhythm: Normal rate and regular rhythm.     Heart sounds: Normal heart sounds. No murmur heard. Pulmonary:     Effort: Pulmonary effort is normal.     Breath sounds: Normal breath sounds. No wheezing or rales.  Musculoskeletal:     Right lower leg: No edema.     Left lower leg: No edema.      VISIT DIAGNOSES:   ICD-10-CM   1. Precordial pain  R07.2     2. Primary hypertension  I10 EKG 12-Lead    3. Mixed hyperlipidemia  E78.2         ASSESSMENT AND PLAN: .    Patrick Gallagher is a 43 y.o. male with  hypertension, mixed hyperlipidemia, obesity  Chest pain: Chest pain most likely musculoskeletal.  Previous workup with exercise treadmill stress test and calcium  score was unremarkable.  Low suspicion for ACS.  Regardless, I will start him on metoprolol  succinate 50 mg daily which would help hypertension, as well as chest pain if it were to be truly angina in etiology. Lightheadedness symptoms as well as elevated heart rate likely due to dehydration.  Recommend liberal hydration.  Hypertension: Uncontrolled.  Continue losartan  100 mg daily.  Added metoprolol  succinate 50 mg daily.  Mixed hyperlipidemia: Continue statin.  Will have follow-up lipid panel  with PCP at next visit.       F/u in 10/2024.  Signed, Newman JINNY Lawrence, MD

## 2024-07-05 NOTE — Patient Instructions (Signed)
 Medication Instructions:   START Metoprolol  Succinate (toprol  XL) 50 mg daily at night   *If you need a refill on your cardiac medications before your next appointment, please call your pharmacy*  Your next appointment:   4 month(s)    (10/2024)  Provider:   Newman JINNY Lawrence, MD

## 2024-09-19 ENCOUNTER — Other Ambulatory Visit: Payer: Self-pay | Admitting: Family Medicine

## 2024-09-25 ENCOUNTER — Other Ambulatory Visit: Payer: Self-pay | Admitting: Family Medicine

## 2024-09-27 ENCOUNTER — Other Ambulatory Visit: Payer: Self-pay | Admitting: Family Medicine

## 2024-09-27 ENCOUNTER — Ambulatory Visit: Payer: Self-pay | Admitting: Family Medicine

## 2024-09-27 ENCOUNTER — Telehealth: Payer: Self-pay

## 2024-09-27 ENCOUNTER — Encounter: Payer: Self-pay | Admitting: Family Medicine

## 2024-09-27 ENCOUNTER — Ambulatory Visit: Admitting: Family Medicine

## 2024-09-27 ENCOUNTER — Other Ambulatory Visit (HOSPITAL_COMMUNITY): Payer: Self-pay

## 2024-09-27 VITALS — BP 138/90 | HR 65 | Temp 97.6°F | Ht 68.0 in | Wt 369.0 lb

## 2024-09-27 DIAGNOSIS — Z6841 Body Mass Index (BMI) 40.0 and over, adult: Secondary | ICD-10-CM | POA: Diagnosis not present

## 2024-09-27 DIAGNOSIS — I10 Essential (primary) hypertension: Secondary | ICD-10-CM

## 2024-09-27 DIAGNOSIS — E782 Mixed hyperlipidemia: Secondary | ICD-10-CM | POA: Diagnosis not present

## 2024-09-27 DIAGNOSIS — E662 Morbid (severe) obesity with alveolar hypoventilation: Secondary | ICD-10-CM | POA: Diagnosis not present

## 2024-09-27 DIAGNOSIS — G4733 Obstructive sleep apnea (adult) (pediatric): Secondary | ICD-10-CM

## 2024-09-27 LAB — COMPREHENSIVE METABOLIC PANEL WITH GFR
ALT: 21 U/L (ref 0–53)
AST: 16 U/L (ref 0–37)
Albumin: 4 g/dL (ref 3.5–5.2)
Alkaline Phosphatase: 63 U/L (ref 39–117)
BUN: 22 mg/dL (ref 6–23)
CO2: 26 meq/L (ref 19–32)
Calcium: 9 mg/dL (ref 8.4–10.5)
Chloride: 101 meq/L (ref 96–112)
Creatinine, Ser: 0.89 mg/dL (ref 0.40–1.50)
GFR: 104.55 mL/min (ref >60.00)
Glucose, Bld: 81 mg/dL (ref 70–99)
Potassium: 3.8 meq/L (ref 3.5–5.1)
Sodium: 137 meq/L (ref 135–145)
Total Bilirubin: 0.5 mg/dL (ref 0.2–1.2)
Total Protein: 7 g/dL (ref 6.0–8.3)

## 2024-09-27 LAB — LIPID PANEL
Cholesterol: 123 mg/dL (ref 0–200)
HDL: 29 mg/dL — ABNORMAL LOW (ref >39.00)
LDL Cholesterol: 78 mg/dL (ref 0–99)
NonHDL: 94.08
Total CHOL/HDL Ratio: 4
Triglycerides: 78 mg/dL (ref 0.0–149.0)
VLDL: 15.6 mg/dL (ref 0.0–40.0)

## 2024-09-27 MED ORDER — ZEPBOUND 12.5 MG/0.5ML ~~LOC~~ SOAJ: 12.5000 mg | SUBCUTANEOUS | 2 refills | Status: DC

## 2024-09-27 NOTE — Progress Notes (Signed)
 Cholesterol is great!   Same meds

## 2024-09-27 NOTE — Progress Notes (Signed)
 Subjective:     Patient ID: Patrick Gallagher, male    DOB: [birth year], 43 y.o.   MRN: [id number]  Chief Complaint  Patient presents with   Hypertension    72mo follow up; no concerns    HPI Discussed the use of AI scribe software for clinical note transcription with the patient, who gave verbal consent to proceed.  History of Present Illness Patrick KELEMEN is a 43 year old male with hypertension and hyperlipidemia who presents for follow-up on blood pressure, cholesterol, and weight management.  He monitors his blood pressure at home, with readings generally around 138/80 mmHg. During a recent cardiologist visit, his blood pressure was 150/101 mmHg, which he attributes to dehydration and feeling unwell. He experiences occasional chest tightness, particularly when tired at night, with relief from stretching. He is currently taking losartan  100 mg and metoprolol  50 mg, noting higher readings in the clinic compared to at home.  For cholesterol management, he has been on atorvastatin  20 mg since his last visit and denies any muscle aches associated with the medication.  In terms of weight management, he was previously on Zepbound  10 mg but has been without it for a week and a half due to a prescription refill issue. He reports weight loss and lifestyle changes, including giving up daily coffee. He does not use a CPAP machine. He continues to take Pepcid  and reports no significant swelling, attributing this to weight loss, especially after previous ankle issues. He engages in outdoor activities for exercise. Wt loss has slowed down some.  No SE from meds  No shortness of breath or muscle aches. Occasional chest tightness, particularly when tired, and no significant swelling.    Health Maintenance Due  Topic Date Due   Hepatitis B Vaccines 19-59 Average Risk (1 of 3 - 19+ 3-dose series) Never done   HPV VACCINES (1 - 3-dose SCDM series) Never done   COVID-19 Vaccine (1 - 2025-26 season)  Never done    Past Medical History:  Diagnosis Date   Achilles tendon disorder    HTN (hypertension)    Morbid obesity (HCC)    OSA (obstructive sleep apnea)    Prediabetes    Tendon injury    right forearm bicep   Tubular adenoma of colon 05/2016    Past Surgical History:  Procedure Laterality Date   ACHILLES TENDON SURGERY Left 07/10/2022   Procedure: ACHILLES TENDON REPAIR;  Surgeon: Patrick Gallagher, DPM;  Location: [facility];  Service: Podiatry;  Laterality: Left;   CALCANEAL OSTEOTOMY Left 07/10/2022   Procedure: CALCANEAL OSTEOTOMY;  Surgeon: Patrick Gallagher, DPM;  Location: [facility];  Service: Podiatry;  Laterality: Left;   COLONOSCOPY WITH PROPOFOL  N/A 06/09/2016   Procedure: COLONOSCOPY WITH PROPOFOL ;  Surgeon: Patrick ONEIDA Buddy, MD;  Location: [facility];  Service: Endoscopy;  Laterality: N/A;   COLONOSCOPY WITH PROPOFOL  N/A 10/31/2019   Procedure: COLONOSCOPY WITH PROPOFOL ;  Surgeon: Gallagher Patrick ONEIDA, MD;  Location: [facility];  Service: Endoscopy;  Laterality: N/A;   FOOT SURGERY     as a child   GASTROC RECESSION EXTREMITY Left 07/10/2022   Procedure: GASTROC RECESSION EXTREMITY;  Surgeon: Patrick Gallagher, DPM;  Location: [facility];  Service: Podiatry;  Laterality: Left;   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 last one 09/08   URETHROPLASTY       Current Outpatient Medications:    atorvastatin  (LIPITOR) 20 MG tablet, TAKE 1 TABLET BY MOUTH EVERY DAY,  Disp: 90 tablet, Rfl: 0   famotidine  (PEPCID ) 20 MG tablet, Take 1 tablet (20 mg total) by mouth 2 (two) times daily., Disp: 180 tablet, Rfl: 1   losartan  (COZAAR ) 100 MG tablet, Take 1 tablet (100 mg total) by mouth daily., Disp: 90 tablet, Rfl: 1   metoprolol  succinate (TOPROL -XL) 50 MG 24 hr tablet, Take 1 tablet (50 mg total) by mouth at bedtime. Take with or immediately following a meal., Disp: 90 tablet, Rfl: 3   tirzepatide  (ZEPBOUND ) 12.5 MG/0.5ML Pen, Inject 12.5 mg into the skin once a  week., Disp: 2 mL, Rfl: 2  No Known Allergies ROS neg/noncontributory except as noted HPI/below      Objective:     BP (!) 138/90 (BP Location: Right Arm, Cuff Size: Large)   Pulse 65   Temp 97.6 F (36.4 C)   Ht 5' 8 (1.727 m)   Wt (!) 369 lb (167.4 kg)   SpO2 96%   BMI 56.11 kg/m  Wt Readings from Last 3 Encounters:  09/27/24 (!) 369 lb (167.4 kg)  07/05/24 (!) 376 lb (170.6 kg)  06/27/24 (!) 376 lb 2 oz (170.6 kg)    Physical Exam   Gen: WDWN NAD HEENT: NCAT, conjunctiva not injected, sclera nonicteric NECK:  supple, no thyromegaly, no nodes, no carotid bruits CARDIAC: RRR, S1S2+, no murmur. DP 2+B LUNGS: CTAB. No wheezes ABDOMEN:  BS+, soft, NTND, No HSM, no masses EXT:  no edema MSK: no gross abnormalities.  NEURO: A&O x3.  CN II-XII intact.  PSYCH: normal mood. Good eye contact     Assessment & Plan:  Mixed hyperlipidemia -     Comprehensive metabolic panel with GFR -     Lipid panel  Primary hypertension  Obesity with alveolar hypoventilation and body mass index (BMI) of 40 or greater (HCC) -     Zepbound ; Inject 12.5 mg into the skin once a week.  Dispense: 2 mL; Refill: 2  OSA (obstructive sleep apnea) -     Zepbound ; Inject 12.5 mg into the skin once a week.  Dispense: 2 mL; Refill: 2  Assessment and Plan Assessment & Plan Hypertension   Blood pressure remains elevated at 150/101 mmHg during dehydration, with home readings around 138/80 mmHg, suggesting possible white coat hypertension. Occasional chest tightness is likely musculoskeletal, with no shortness of breath. Continue losartan  100 mg and metoprolol  50 mg. Monitor blood pressure at home regularly and report if it frequently reaches 140/90 mmHg or higher.  Musculoskeletal chest pain   Intermittent chest tightness is exacerbated by fatigue and improves with stretching exercises. Perform stretching exercises, including arm circles and pulling arms behind the back, to alleviate chest  tightness.  Hyperlipidemia   Managed with atorvastatin  20 mg, initiated at the last visit. No muscle aches or adverse effects reported. Continue atorvastatin  20 mg and monitor cholesterol levels to assess efficacy of treatment.  Obesity   Weight loss achieved and lifestyle changes maintained. Previous Zepbound  10 mg prescription not refilled due to miscommunication. Send prescription for Zepbound  12.5 mg with two refills to [facility] on [address]. Monitor weight and adjust medication as needed.  General Health Maintenance   Received flu vaccination and engages in regular outdoor exercise. Continue regular exercise and lifestyle modifications.    Return in about 3 months (around 12/28/2024) for chronic follow-up.  Jenkins CHRISTELLA Carrel, MD

## 2024-09-27 NOTE — Patient Instructions (Signed)
 It was very nice to see you today!  Zepbound  sent   PLEASE NOTE:  If you had any lab tests please let us  know if you have not heard back within a few days. You may see your results on MyChart before we have a chance to review them but we will give you a call once they are reviewed by us . If we ordered any referrals today, please let us  know if you have not heard from their office within the next week.   Please try these tips to maintain a healthy lifestyle:  Eat most of your calories during the day when you are active. Eliminate processed foods including packaged sweets (pies, cakes, cookies), reduce intake of potatoes, white bread, white pasta, and white rice. Look for whole grain options, oat flour or almond flour.  Each meal should contain half fruits/vegetables, one quarter protein, and one quarter carbs (no bigger than a computer mouse).  Cut down on sweet beverages. This includes juice, soda, and sweet tea. Also watch fruit intake, though this is a healthier sweet option, it still contains natural sugar! Limit to 3 servings daily.  Drink at least 1 glass of water with each meal and aim for at least 8 glasses per day  Exercise at least 150 minutes every week.

## 2024-09-27 NOTE — Telephone Encounter (Signed)
 Pharmacy Patient Advocate Encounter   Received notification from Pt Calls Messages that prior authorization for Zepbound  12.5MG /0.5ML pen-injectors is required/requested.   Insurance verification completed.   The patient is insured through [facility].   Per test claim: PA required; PA submitted to above mentioned insurance via Latent Key/confirmation #/EOC [id number] Status is pending

## 2024-09-28 ENCOUNTER — Other Ambulatory Visit (HOSPITAL_COMMUNITY): Payer: Self-pay

## 2024-09-28 NOTE — Telephone Encounter (Signed)
 Pharmacy Patient Advocate Encounter  Received notification from [employer] that Prior Authorization for Zepbound  12.5MG /0.5ML pen-injectors has been DENIED.  Full denial letter will be uploaded to the media tab. See denial reason below.    PA #/Case ID/Reference #: [id number]

## 2024-10-06 ENCOUNTER — Other Ambulatory Visit (HOSPITAL_COMMUNITY): Payer: Self-pay

## 2024-10-12 ENCOUNTER — Encounter: Payer: Self-pay | Admitting: Family Medicine

## 2024-10-12 NOTE — Telephone Encounter (Signed)
 Please see pt msg and advise any recommendations

## 2024-10-23 ENCOUNTER — Other Ambulatory Visit: Payer: Self-pay | Admitting: Family Medicine

## 2024-10-23 DIAGNOSIS — I10 Essential (primary) hypertension: Secondary | ICD-10-CM

## 2024-10-23 MED ORDER — LOSARTAN POTASSIUM 100 MG PO TABS: 100.0000 mg | ORAL_TABLET | Freq: Every day | ORAL | 1 refills | Status: DC

## 2024-11-01 NOTE — Progress Notes (Signed)
  Cardiology Office Note:  .   Date:  11/01/2024  ID:  Patrick Gallagher, DOB 1981/09/26, MRN [id number] PCP: Wendolyn Jenkins Jansky, MD  [facility] Providers Cardiologist:  Newman Lawrence, MD PCP: Wendolyn Jenkins Jansky, MD  Chief Complaint  Patient presents with   Hypertension      History of Present Illness: .    Patrick Gallagher is a 43 y.o. male with hypertension, mixed hyperlipidemia, obesity  Patient is doing well. Chest wall pain lasting for a few seconds continues to occur from time to time, always at rest and never with exertion. Blood pressure is elevated today. He has been out of metoprolol .    Vitals:   11/02/24 1406  BP: (!) 142/90  Pulse: 80  SpO2: 99%       ROS:  Review of Systems  Cardiovascular:  Positive for chest pain. Negative for dyspnea on exertion, leg swelling, palpitations and syncope.     Studies Reviewed: SABRA        EKG 07/05/2024: Normal sinus rhythm Septal infarct , age undetermined No previous ECGs available    Exercise treadmill stress test 08/19/2023: Exercise treadmill stress test performed using Bruce protocol.  Patient exercised for a total of 4 minutes and 28 seconds, achieving 6.4 METS, and 107% of age predicted maximum heart rate.  Exercise capacity was low.  No chest pain reported.  Exaggerated heart rate response. Normal blood pressure response.  Peak ECG is uninterpretable due to artifact. ECG at 00:59 min into recovery demonstrated sinus tachycardia.  No significant ST-T changes. Low risk study.  CT cardiac scoring 08/02/2023: Calcium  score 0  Labs 09/2024: Chol 123, TG 78, HDL 29, LDL 78 HbA1C 5.7% Hb 15.2 Cr 0.89 TSH 3.01   Labs 05/2023: Chol 206, TG 127, HDL 40, LDL 140     Physical Exam:   Physical Exam Vitals and nursing note reviewed.  Constitutional:      General: He is not in acute distress. Neck:     Vascular: No JVD.  Cardiovascular:     Rate and Rhythm: Normal rate and regular rhythm.      Heart sounds: Normal heart sounds. No murmur heard. Pulmonary:     Effort: Pulmonary effort is normal.     Breath sounds: Normal breath sounds. No wheezing or rales.  Musculoskeletal:     Right lower leg: No edema.     Left lower leg: No edema.      VISIT DIAGNOSES:   ICD-10-CM   1. Precordial pain  R07.2     2. Primary hypertension  I10          ASSESSMENT AND PLAN: .    Patrick Gallagher is a 43 y.o. male with  hypertension, mixed hyperlipidemia, obesity  Chest pain: Likely musculoskeletal. Normal calcium  score and exercise treadmill stress test iwhtin last two years.  Hypertension: Uncontrolled.  Continue losartan  100 mg daily.  He has bene out of metoprolol . Instead of metoprolol , recommend amlodipine  5 mg daily.  Mixed hyperlipidemia: Continue statin.   Continue f/u w/PCP. I will see him as needed.   Signed, Newman JINNY Lawrence, MD

## 2024-11-02 ENCOUNTER — Ambulatory Visit: Attending: Cardiology | Admitting: Cardiology

## 2024-11-02 VITALS — BP 142/90 | HR 80 | Ht 69.0 in | Wt 379.0 lb

## 2024-11-02 DIAGNOSIS — I10 Essential (primary) hypertension: Secondary | ICD-10-CM | POA: Diagnosis not present

## 2024-11-02 DIAGNOSIS — R072 Precordial pain: Secondary | ICD-10-CM | POA: Diagnosis not present

## 2024-11-02 MED ORDER — AMLODIPINE BESYLATE 5 MG PO TABS: 5.0000 mg | ORAL_TABLET | Freq: Every day | ORAL | 1 refills | Status: AC

## 2024-11-02 NOTE — Patient Instructions (Signed)
 Medication Instructions:  Please STOP TOPROL  XL.   Please START AMLODIPINE 5 mg daily.   *If you need a refill on your cardiac medications before your next appointment, please call your pharmacy*  Lab Work: None.   If you have labs (blood work) drawn today and your tests are completely normal, you will receive your results only by: MyChart Message (if you have MyChart) OR A paper copy in the mail If you have any lab test that is abnormal or we need to change your treatment, we will call you to review the results.  Testing/Procedures: None.  Follow-Up: Your next appointment will be as needed and it will be with   Provider:   Newman JINNY Lawrence, MD

## 2024-11-03 ENCOUNTER — Encounter: Payer: Self-pay | Admitting: Cardiology

## 2024-12-27 ENCOUNTER — Other Ambulatory Visit: Payer: Self-pay | Admitting: Family Medicine

## 2025-01-01 ENCOUNTER — Ambulatory Visit: Admitting: Family Medicine

## 2025-01-01 ENCOUNTER — Encounter: Payer: Self-pay | Admitting: Family Medicine

## 2025-01-01 VITALS — BP 124/80 | HR 86 | Temp 97.2°F | Ht 69.0 in | Wt 385.5 lb

## 2025-01-01 DIAGNOSIS — E782 Mixed hyperlipidemia: Secondary | ICD-10-CM

## 2025-01-01 DIAGNOSIS — I10 Essential (primary) hypertension: Secondary | ICD-10-CM

## 2025-01-01 MED ORDER — ATORVASTATIN CALCIUM 20 MG PO TABS: 20.0000 mg | ORAL_TABLET | Freq: Every day | ORAL | 0 refills | Status: AC

## 2025-01-01 MED ORDER — LOSARTAN POTASSIUM 100 MG PO TABS: 100.0000 mg | ORAL_TABLET | Freq: Every day | ORAL | 1 refills | Status: AC

## 2025-01-01 NOTE — Progress Notes (Signed)
 "  Subjective:     Patient ID: Patrick Gallagher, male    DOB: 05/26/1981, 44 y.o.   MRN: [id number]  Chief Complaint  Patient presents with   Hyperlipidemia   Hypertension    Pt is here for chronic issues    Discussed the use of AI scribe software for clinical note transcription with the patient, who gave verbal consent to proceed.  History of Present Illness Patrick Gallagher is a 44 year old male with hypertension, hyperlipidemia, and obesity who presents for follow-up on his blood pressure, cholesterol, and weight management.  He has experienced a weight gain of approximately 16 pounds since October, with his weight increasing from 369 pounds to 385 pounds. He attributes this weight gain to the cessation of Zepbound  due to insurance coverage issues. Despite working on exercise and diet, his appetite and 'food noise' have returned since stopping the medication.  Regarding his hypertension, he occasionally checks his blood pressure at home, especially when he feels 'a little weird.' He feels decent over the past couple of months. He is currently taking losartan  100 mg and amlodipine  5 mg, and his blood pressure readings have been good.  He is on atorvastatin  for hyperlipidemia and has no muscle aches or pains associated with this medication. His blood work in October was reported as good.  He mentions an itchy spot on his leg, which he suspects might be dry skin. He has not been using Pepcid  since stopping the Zepbound  injections.  No chest pain, coughing, shortness of breath, or swelling in the legs.    There are no preventive care reminders to display for this patient.  Past Medical History:  Diagnosis Date   Achilles tendon disorder    HTN (hypertension)    Morbid obesity (HCC)    OSA (obstructive sleep apnea)    Prediabetes    Tendon injury    right forearm bicep   Tubular adenoma of colon 05/2016    Past Surgical History:  Procedure Laterality Date   ACHILLES TENDON  SURGERY Left 07/10/2022   Procedure: ACHILLES TENDON REPAIR;  Surgeon: Silva Juliene JONELLE, DPM;  Location: [facility];  Service: Podiatry;  Laterality: Left;   CALCANEAL OSTEOTOMY Left 07/10/2022   Procedure: CALCANEAL OSTEOTOMY;  Surgeon: Silva Juliene JONELLE, DPM;  Location: [facility];  Service: Podiatry;  Laterality: Left;   COLONOSCOPY WITH PROPOFOL  N/A 06/09/2016   Procedure: COLONOSCOPY WITH PROPOFOL ;  Surgeon: Gwendlyn ONEIDA Buddy, MD;  Location: [facility];  Service: Endoscopy;  Laterality: N/A;   COLONOSCOPY WITH PROPOFOL  N/A 10/31/2019   Procedure: COLONOSCOPY WITH PROPOFOL ;  Surgeon: Buddy Gwendlyn ONEIDA, MD;  Location: [facility];  Service: Endoscopy;  Laterality: N/A;   FOOT SURGERY     as a child   GASTROC RECESSION EXTREMITY Left 07/10/2022   Procedure: GASTROC RECESSION EXTREMITY;  Surgeon: Silva Juliene JONELLE, DPM;  Location: [facility];  Service: Podiatry;  Laterality: Left;   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 last one 09/08   URETHROPLASTY      Current Medications[1]  Allergies[2] ROS neg/noncontributory except as noted HPI/below      Objective:     BP 124/80 (BP Location: Left Arm, Patient Position: Sitting, Cuff Size: Large)   Pulse 86   Temp (!) 97.2 F (36.2 C) (Temporal)   Ht 5' 9 (1.753 m)   Wt (!) 385 lb 8 oz (174.9 kg)   SpO2 98%   BMI 56.93 kg/m  Wt Readings from Last 3  Encounters:  01/01/25 (!) 385 lb 8 oz (174.9 kg)  11/02/24 (!) 379 lb (171.9 kg)  09/27/24 (!) 369 lb (167.4 kg)    Physical Exam MEASUREMENTS: Weight- 385. GENERAL: Well developed, well nourished, no acute distress. HEAD EYES EARS NOSE THROAT: Normocephalic, atraumatic, conjunctiva not injected, sclera nonicteric. CARDIAC: Regular rate and rhythm, S1 S2 present, no murmur, dorsalis pedis 2 plus bilaterally. NECK: Supple, no thyromegaly, no nodes, no carotid bruits. LUNGS: Clear to auscultation bilaterally, no wheezes. ABDOMEN: Bowel sounds present, soft, non-tender,  non-distended, no hepatosplenomegaly, no masses. EXTREMITIES: No edema. MUSCULOSKELETAL: No gross abnormalities. NEUROLOGICAL: Alert and oriented x3, cranial nerves II through XII intact. PSYCHIATRIC: Normal mood, good eye contact. SKIN: No visible rash or lesions.       Assessment & Plan:  Mixed hyperlipidemia -     Atorvastatin  Calcium ; Take 1 tablet (20 mg total) by mouth daily.  Dispense: 90 tablet; Refill: 0  Primary hypertension -     Losartan  Potassium; Take 1 tablet (100 mg total) by mouth daily.  Dispense: 90 tablet; Refill: 1    Assessment and Plan Assessment & Plan Obesity   He has gained 16 pounds since October, now weighing 385 pounds. Previously on Zepbound , which was discontinued due to insurance issues, his appetite has returned, complicating weight management. Discussed options include paying cash for Zepbound , trying Wegovy  pills, or considering gastric bypass. Advised against Retrotide due to safety concerns and lack of regulation on compounded. He should discuss weight management options with his wife and decide on a plan. Consider Wegovy  pills as a more affordable option. Avoid Retrotide. A follow-up will be scheduled in 3 months for accountability and blood work.  Primary hypertension   His blood pressure is well-controlled with losartan  100 mg and amlodipine  5 mg. No recent episodes of chest pain, coughing, shortness of breath, or leg swelling. Blood pressure should be monitored at least once a month. Continue losartan  100 mg and amlodipine  5 mg.  Mixed hyperlipidemia   Currently managed with atorvastatin  without reported muscle aches or pains. Continue atorvastatin .     Return in about 3 months (around 04/01/2025) for chronic follow-up.  Jenkins CHRISTELLA Carrel, MD     [1]  Current Outpatient Medications:    amLODipine  (NORVASC ) 5 MG tablet, Take 1 tablet (5 mg total) by mouth daily., Disp: 90 tablet, Rfl: 1   famotidine  (PEPCID ) 20 MG tablet, Take 1 tablet (20 mg  total) by mouth 2 (two) times daily., Disp: 180 tablet, Rfl: 1   atorvastatin  (LIPITOR) 20 MG tablet, Take 1 tablet (20 mg total) by mouth daily., Disp: 90 tablet, Rfl: 0   losartan  (COZAAR ) 100 MG tablet, Take 1 tablet (100 mg total) by mouth daily., Disp: 90 tablet, Rfl: 1 [2] No Known Allergies  "

## 2025-01-01 NOTE — Patient Instructions (Signed)
 It was very nice to see you today!  Sarna for itch.  Moisturizer.   PLEASE NOTE:  If you had any lab tests please let us  know if you have not heard back within a few days. You may see your results on MyChart before we have a chance to review them but we will give you a call once they are reviewed by us . If we ordered any referrals today, please let us  know if you have not heard from their office within the next week.   Please try these tips to maintain a healthy lifestyle:  Eat most of your calories during the day when you are active. Eliminate processed foods including packaged sweets (pies, cakes, cookies), reduce intake of potatoes, white bread, white pasta, and white rice. Look for whole grain options, oat flour or almond flour.  Each meal should contain half fruits/vegetables, one quarter protein, and one quarter carbs (no bigger than a computer mouse).  Cut down on sweet beverages. This includes juice, soda, and sweet tea. Also watch fruit intake, though this is a healthier sweet option, it still contains natural sugar! Limit to 3 servings daily.  Drink at least 1 glass of water with each meal and aim for at least 8 glasses per day  Exercise at least 150 minutes every week.

## 2025-01-06 ENCOUNTER — Other Ambulatory Visit (HOSPITAL_COMMUNITY): Payer: Self-pay

## 2025-04-04 ENCOUNTER — Ambulatory Visit: Admitting: Family Medicine
